# Patient Record
Sex: Male | Born: 1940 | Race: White | Hispanic: No | Marital: Married | Smoking: Never smoker
Health system: Southern US, Community
[De-identification: ages and names within clinical notes are randomized; demographics above are authoritative.]

## PROBLEM LIST (undated history)

## (undated) DIAGNOSIS — I10 Essential (primary) hypertension: Secondary | ICD-10-CM

## (undated) DIAGNOSIS — M1990 Unspecified osteoarthritis, unspecified site: Secondary | ICD-10-CM

## (undated) DIAGNOSIS — R9439 Abnormal result of other cardiovascular function study: Secondary | ICD-10-CM

## (undated) DIAGNOSIS — E785 Hyperlipidemia, unspecified: Secondary | ICD-10-CM

## (undated) DIAGNOSIS — I213 ST elevation (STEMI) myocardial infarction of unspecified site: Secondary | ICD-10-CM

## (undated) DIAGNOSIS — Z955 Presence of coronary angioplasty implant and graft: Secondary | ICD-10-CM

## (undated) DIAGNOSIS — K219 Gastro-esophageal reflux disease without esophagitis: Secondary | ICD-10-CM

## (undated) DIAGNOSIS — J189 Pneumonia, unspecified organism: Secondary | ICD-10-CM

## (undated) DIAGNOSIS — J45909 Unspecified asthma, uncomplicated: Secondary | ICD-10-CM

## (undated) DIAGNOSIS — I2510 Atherosclerotic heart disease of native coronary artery without angina pectoris: Secondary | ICD-10-CM

## (undated) HISTORY — DX: Hyperlipidemia, unspecified: E78.5

## (undated) HISTORY — DX: Atherosclerotic heart disease of native coronary artery without angina pectoris: I25.10

## (undated) HISTORY — DX: ST elevation (STEMI) myocardial infarction of unspecified site: I21.3

## (undated) HISTORY — DX: Abnormal result of other cardiovascular function study: R94.39

## (undated) HISTORY — DX: Presence of coronary angioplasty implant and graft: Z95.5

## (undated) HISTORY — DX: Essential (primary) hypertension: I10

## (undated) HISTORY — PX: COLONOSCOPY: SHX174

---

## 1997-10-24 ENCOUNTER — Encounter: Admission: RE | Admit: 1997-10-24 | Discharge: 1998-01-22 | Payer: Self-pay | Admitting: *Deleted

## 2012-09-14 ENCOUNTER — Encounter (HOSPITAL_COMMUNITY): Payer: Self-pay | Admitting: Emergency Medicine

## 2012-09-14 ENCOUNTER — Emergency Department (HOSPITAL_COMMUNITY)
Admission: EM | Admit: 2012-09-14 | Discharge: 2012-09-14 | Disposition: A | Discharge status: Home / Self Care | Payer: Medicare Other | Attending: Emergency Medicine | Admitting: Emergency Medicine

## 2012-09-14 DIAGNOSIS — M5430 Sciatica, unspecified side: Secondary | ICD-10-CM | POA: Insufficient documentation

## 2012-09-14 DIAGNOSIS — S7401XA Injury of sciatic nerve at hip and thigh level, right leg, initial encounter: Secondary | ICD-10-CM

## 2012-09-14 DIAGNOSIS — Z8739 Personal history of other diseases of the musculoskeletal system and connective tissue: Secondary | ICD-10-CM | POA: Insufficient documentation

## 2012-09-14 DIAGNOSIS — Z8719 Personal history of other diseases of the digestive system: Secondary | ICD-10-CM | POA: Insufficient documentation

## 2012-09-14 DIAGNOSIS — J45909 Unspecified asthma, uncomplicated: Secondary | ICD-10-CM | POA: Insufficient documentation

## 2012-09-14 HISTORY — DX: Unspecified osteoarthritis, unspecified site: M19.90

## 2012-09-14 HISTORY — DX: Unspecified asthma, uncomplicated: J45.909

## 2012-09-14 HISTORY — DX: Gastro-esophageal reflux disease without esophagitis: K21.9

## 2012-09-14 MED ORDER — NAPROXEN 500 MG PO TABS: 500.0000 mg | ORAL_TABLET | Freq: Two times a day (BID) | ORAL | Status: DC

## 2012-09-14 MED ORDER — CYCLOBENZAPRINE HCL 10 MG PO TABS
10.0000 mg | ORAL_TABLET | Freq: Once | ORAL | Status: AC
  Administered 2012-09-14: 10 mg via ORAL
  Filled 2012-09-14: qty 1

## 2012-09-14 MED ORDER — HYDROMORPHONE HCL PF 1 MG/ML IJ SOLN
1.0000 mg | Freq: Once | INTRAMUSCULAR | Status: AC
  Administered 2012-09-14: 1 mg via INTRAVENOUS
  Filled 2012-09-14: qty 1

## 2012-09-14 MED ORDER — DIAZEPAM 5 MG PO TABS: 5.0000 mg | ORAL_TABLET | Freq: Three times a day (TID) | ORAL | Status: DC | PRN

## 2012-09-14 MED ORDER — HYDROMORPHONE HCL 2 MG PO TABS
2.0000 mg | ORAL_TABLET | Freq: Once | ORAL | Status: AC
  Administered 2012-09-14: 2 mg via ORAL
  Filled 2012-09-14: qty 1

## 2012-09-14 MED ORDER — ONDANSETRON HCL 4 MG/2ML IJ SOLN
4.0000 mg | Freq: Once | INTRAMUSCULAR | Status: AC
  Administered 2012-09-14: 4 mg via INTRAVENOUS
  Filled 2012-09-14: qty 2

## 2012-09-14 MED ORDER — ONDANSETRON HCL 4 MG PO TABS: 4.0000 mg | ORAL_TABLET | Freq: Three times a day (TID) | ORAL | Status: DC | PRN

## 2012-09-14 MED ORDER — HYDROMORPHONE HCL 2 MG PO TABS: 2.0000 mg | ORAL_TABLET | ORAL | Status: DC | PRN

## 2012-09-14 MED ORDER — KETOROLAC TROMETHAMINE 30 MG/ML IJ SOLN
30.0000 mg | Freq: Once | INTRAMUSCULAR | Status: AC
  Administered 2012-09-14: 30 mg via INTRAVENOUS
  Filled 2012-09-14: qty 1

## 2012-09-14 NOTE — ED Notes (Signed)
Patient states he has white coat syndrome and his bp elevates when around doctors or hospitals.   Patient claims he also has stomach burning secondary to taking ASA 2 x Q4H for 2 years.

## 2012-09-14 NOTE — ED Notes (Signed)
Patient states he has been having sciatica pain for 10 days.  Patient states he did see his primary doctor last Tuesday who diagnosed.  Patient states that he took a 6 day pack of prednisone, but took only 5 days of the medication because it did not work for him.  Patient states extremely uncomfortable.  Patient advised has not been been able to move to the side or lay on his back or stand.    Patient states he has been taking hydrocodone 750 mg, but that it is no longer working.

## 2012-09-14 NOTE — ED Notes (Signed)
Patient placed on O2 2L [place] for dropped sats.

## 2012-09-14 NOTE — ED Provider Notes (Signed)
History     CSN: [id number]  Arrival date & time 09/14/12  0736   First MD Initiated Contact with Patient 09/14/12 [phone]      Chief Complaint  Patient presents with  . Sciatica    R sided low back back with radiation all the way down R leg.     (Consider location/radiation/quality/duration/timing/severity/associated sxs/prior treatment) The history is provided by the patient.   72 year old male comes in complaining of pain in the right buttock radiating down his right leg to his right foot. This is been present for the last 10 days and is getting worse. It started after he had done some unusual lifting. Pain is severe and he rates at 10/10. It is worse if he stands or tries to walk but he is not able to find a position which is comfortable. He has some numbness in his right foot. Has not noticed any weakness. There is no bowel or bladder dysfunction. He saw his PCP who gave him a course of prednisone which did not give any relief. He has been taking hydrocodone which gives only slight relief. He did have a prior episode of similar pain about 5 years ago.  Past Medical History  Diagnosis Date  . Arthritis   . Asthma   . GERD (gastroesophageal reflux disease)     History reviewed. No pertinent past surgical history.  No family history on file.  History  Substance Use Topics  . Smoking status: Never Smoker   . Smokeless tobacco: Not on file  . Alcohol Use: No      Review of Systems  All other systems reviewed and are negative.    Allergies  Review of patient's allergies indicates no known allergies.  Home Medications  No current outpatient prescriptions on file.  BP 188/104  Pulse 91  Temp(Src) 98.5 F (36.9 C) (Oral)  Resp 22  Ht 6\' 1"  (1.854 m)  Wt 215 lb (97.523 kg)  BMI 28.37 kg/m2  SpO2 97%  Physical Exam  Nursing note and vitals reviewed.  72 year old male, resting comfortably and in no acute distress. Vital signs are significant for hypertension with  blood pressure 180/104, and tachypnea with respiratory rate of 20 to. Oxygen saturation is 97%, which is normal. Head is normocephalic and atraumatic. PERRLA, EOMI. Oropharynx is clear. Neck is nontender and supple without adenopathy or JVD. Back is mildly tender in the lower lumbar area with mild right paralumbar spasm. Straight leg raise is positive on the right at 15 and crossed straight leg raise is positive on the left at 45. There is no CVA tenderness. Lungs are clear without rales, wheezes, or rhonchi. Chest is nontender. Heart has regular rate and rhythm without murmur. Abdomen is soft, flat, nontender without masses or hepatosplenomegaly and peristalsis is normoactive. Extremities have no cyanosis or edema, full range of motion is present. Skin is warm and dry without rash. Neurologic: Mental status is normal, cranial nerves are intact, there are no motor or sensory deficits. Sensation is normal throughout his right leg and deep tendon reflexes are symmetric and muscle strength is normal and all the muscles of his right  ED Course  Procedures (including critical care time)   1. Sciatic nerve injury, right, initial encounter       MDM  Right-sided sciatica, without evidence of neurologic injury. He will be treated with ketorolac, hydromorphone, and cyclobenzaprine.  He got partial relief with this treatment. Hydromorphone was repeated with improved relief. He'll be discharged with  prescriptions for hydromorphone, naproxen, and diazepam. I have discussed with the patient that he needs to go through 4-6 weeks of conservative treatment prior to consideration for surgery but he is given referral to on-call neurosurgeon.        Dione Booze, MD 09/14/12 1056

## 2012-09-14 NOTE — ED Notes (Signed)
Went to discharge patient.   Patient wants prescription for zofran to take home.  Patient also wants another dose of pain medication because they have a 30-40 minute drive home.

## 2016-02-16 DIAGNOSIS — Z955 Presence of coronary angioplasty implant and graft: Secondary | ICD-10-CM

## 2016-02-16 DIAGNOSIS — I213 ST elevation (STEMI) myocardial infarction of unspecified site: Secondary | ICD-10-CM

## 2016-02-16 DIAGNOSIS — I2510 Atherosclerotic heart disease of native coronary artery without angina pectoris: Secondary | ICD-10-CM | POA: Insufficient documentation

## 2016-02-16 HISTORY — DX: Presence of coronary angioplasty implant and graft: Z95.5

## 2016-02-16 HISTORY — DX: ST elevation (STEMI) myocardial infarction of unspecified site: I21.3

## 2016-02-16 HISTORY — DX: Atherosclerotic heart disease of native coronary artery without angina pectoris: I25.10

## 2016-08-13 DIAGNOSIS — R9439 Abnormal result of other cardiovascular function study: Secondary | ICD-10-CM | POA: Insufficient documentation

## 2016-08-13 HISTORY — DX: Abnormal result of other cardiovascular function study: R94.39

## 2016-09-02 ENCOUNTER — Encounter: Payer: Self-pay | Admitting: *Deleted

## 2016-09-02 DIAGNOSIS — I10 Essential (primary) hypertension: Secondary | ICD-10-CM | POA: Insufficient documentation

## 2016-09-02 DIAGNOSIS — E785 Hyperlipidemia, unspecified: Secondary | ICD-10-CM | POA: Insufficient documentation

## 2016-09-03 ENCOUNTER — Encounter: Payer: Self-pay | Admitting: *Deleted

## 2016-09-03 ENCOUNTER — Institutional Professional Consult (permissible substitution) (INDEPENDENT_AMBULATORY_CARE_PROVIDER_SITE_OTHER): Payer: Medicare Other | Admitting: Cardiothoracic Surgery

## 2016-09-03 ENCOUNTER — Encounter: Payer: Self-pay | Admitting: Cardiothoracic Surgery

## 2016-09-03 VITALS — BP 143/82 | HR 63 | Resp 16 | Ht 72.5 in | Wt 214.0 lb

## 2016-09-03 DIAGNOSIS — I2510 Atherosclerotic heart disease of native coronary artery without angina pectoris: Secondary | ICD-10-CM | POA: Diagnosis not present

## 2016-09-03 NOTE — Progress Notes (Signed)
PCP is Forrest MoronUEHLE, STEPHEN, MD Referring Provider is Caryl Adahiu, Jenyung Andy, MD  Chief Complaint  Patient presents with  . 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 At that time the patient was admitted to the [facility] and had urgent PCI of the RCA with 2 drug-eluting stents. He had inferior wall hypokinesia. He had a untoward reaction to Phenergan with altered mental status vomiting and possible aspiration. He was in ICU for approximately 3 days but eventually stabilized and has recovered. He completed cardiac [facility].  The patient was noted have residual multivessel disease at the time of the cath in September. His LAD diagonal had 90% stenosis. The ramus intermediate had 70% stenosis in the circumflex was totally occluded. LVEF was 40%.  The patient was seen back by his cardiologist Dr. Rhona Leavenshiu who performed a stress test with echocardiogram. This showed a positive ischemic response. He subsequently underwent cardiac catheterization on March 19. This showed severe stenosis of the LAD, diagonal and ramus with chronic occlusion of he circumflex with collaterals from the right coronary. RCA stents were patent. EF was 35-40 percent. The cardiologist has recommended consideration for surgical coronary revascularization based on his coronary anatomy and positive stress test and reduced LV function. Patient also has business commitments that require international travel and he is concerned over further cardiac events while out of accessible medical care.  The patient currently works 8-12 hours daily and denies angina fatigue or symptoms of  heart failure including PND orthopnea. He feels he has recovered fairly well from his event last September, 6 months ago.  The patient has been on Effient now for 6 months after his PCI without significant bleeding complications Past Medical History:  Diagnosis Date  . Abnormal stress echo 08/13/2016  . Arthritis   . Asthma   . 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 CAD in native artery   . GERD (gastroesophageal reflux disease)   . History of heart artery stent 02/16/2016  . Hyperlipidemia   . Hypertension   . STEMI (ST elevation myocardial infarction) (HCC) 02/16/2016    No past surgical history on file.  No family history on file.  Social History Social History  Substance Use Topics  . Smoking status: Never Smoker  . Smokeless tobacco: Never Used  . Alcohol use No    Current Outpatient Prescriptions  Medication Sig Dispense Refill  . ALPRAZolam (XANAX) 0.5 MG tablet Take 0.25-0.5 mg by mouth 3 (three) times daily as needed for sleep.    Marland Kitchen. aspirin EC 81 MG tablet Take 81 mg by mouth daily.    Marland Kitchen. atorvastatin (LIPITOR) 80 MG tablet Take 80 mg by mouth daily.    Marland Kitchen. esomeprazole (NEXIUM) 20 MG capsule Take 20 mg by mouth daily before breakfast.    . HYDROcodone-acetaminophen (NORCO/VICODIN) 5-325 MG tablet Take 1 tablet by mouth every 3 (three) hours as needed for moderate pain. MORNING/NOON    . lisinopril (PRINIVIL,ZESTRIL) 2.5 MG tablet Take 2.5 mg by mouth daily. TAKE 2 TABLETS DAILY (5 MG)    . metoprolol tartrate (LOPRESSOR) 25 MG tablet Take 25 mg by mouth 2 (two) times daily.    .Marland Kitchen  prasugrel (EFFIENT) 10 MG TABS tablet Take 10 mg by mouth daily.     No current facility-administered medications for this visit.     Allergies  Allergen Reactions  . Phenergan [Promethazine] Other (See Comments)    SEVERE AGITATION    Review of Systems       Patient's wife has significant dementia   Review of Systems :  [ y ] = yes, [  ] = no         General :  Weight gain [   ]    Weight loss  [   ]  Fatigue [  ]  Fever [  ]  Chills  [  ]                                Weakness  [ mild ]           HEENT    Headache [  ]  Dizziness [  ]  Blurred vision [  ] Glaucoma  [  ]                          Nosebleeds [  ] Painful or loose teeth [  ]        Cardiac :  Chest pain/ pressure [  ]  Resting SOB [  ] exertional SOB [mild  ]                        Orthopnea [  ]  Pedal edema  [  ]  Palpitations [  ] Syncope/presyncope [ ]                         Paroxysmal nocturnal dyspnea [  ]         Pulmonary : cough [  ]  wheezing [  ]  Hemoptysis [  ] Sputum [  ] Snoring [  ]                              Pneumothorax [  ]  Sleep apnea [  ]        GI : Vomiting [  ]  Dysphagia [  ]  Melena  [  ]  Abdominal pain [  ] BRBPR [  ]              Heart burn [  ]  Constipation [  ] Diarrhea  [  ] Colonoscopy [  yes ]        GU : Hematuria [  ]  Dysuria [  ]  Nocturia [  ] UTI's [  ]        Vascular : Claudication [  ]  Rest pain [  ]  DVT [  ] Vein stripping [  ] leg ulcers [  ]                          TIA [  ] Stroke [  ]  Varicose veins [  ]        NEURO :  Headaches  [  ] Seizures [  ] Vision changes [  ] Paresthesias [  ]  Seizures [  ]        Musculoskeletal :  Arthritis [  ] Gout  [  ]  Back pain [  ]  Joint pain [  ]pain from cervical arthritis        Skin :  Rash [  ]  Melanoma [  ] Sores [  ]        Heme : Bleeding problems [  ]Clotting Disorders [  ] Anemia [  ]Blood Transfusion [ ]         Endocrine : Diabetes [  ] Heat or Cold intolerance [  ] Polyuria [  ]excessive thirst [ ]         Psych : Depression [  ]  Anxiety [  ]  Psych hospitalizations [  ] Memory change [  ]                                               BP (!) 143/82 (BP Location: Left Arm, Patient Position: Sitting, Cuff Size: Large)   Pulse 63   Resp 16   Ht 6' 0.5" (1.842 m)   Wt 214 lb (97.1 kg)   SpO2 97% Comment: ON RA  BMI  28.62 kg/m  Physical Exam     Physical Exam  General: Well-appearing 76 year old Caucasian male no acute distress HEENT: Normocephalic pupils equal , dentition adequate Neck: Supple without JVD, adenopathy, or bruit Chest: Clear to auscultation, symmetrical breath sounds, no rhonchi, no tenderness             or deformity Cardiovascular: Regular rate and rhythm, no murmur, no gallop, peripheral pulses             palpable in all extremities Abdomen:  Soft, nontender, no palpable mass or organomegaly Extremities: Warm, well-perfused, no clubbing cyanosis edema or tenderness,              no venous stasis changes of the legs Rectal/GU: Deferred Neuro: Grossly non--focal and symmetrical throughout Skin: Clean and dry without rash or ulceration   Diagnostic Tests:  most recent coronary catheterization personally reviewed.   the patient has high-grade stenosis of  the LAD- diagonal. The patient has total occlusion of the circumflex with collateralization of small targets from the right coronary The patient has 70-75% stenosis of a large ramus  Impression:  severe three-vessel coronary disease with moderate LV dysfunction and positive-ischemic response to stress test   Plan:I discussed the role of CABG for treatment of the patient's CAD extensively. I feel the patient would benefit from surgical coronary revascularization with respect to preservation of LV function and improved survival. The details of surgery including the operative procedure and the hospital recovery  werereviewed in detail.   I will speak to the patient's cardiologist Dr. Rhona Leavens and determine when the Effient can be stopped prior to surgery. The patient is now 6 months after PCI and doing well.   after I discuss the patient with his cardiologist a tentative date for surgery be scheduled in mid April.  Mikey Bussing, MD [facility] [phone]

## 2016-09-04 HISTORY — PX: CARDIAC CATHETERIZATION: SHX172

## 2016-09-05 ENCOUNTER — Other Ambulatory Visit: Payer: Self-pay | Admitting: *Deleted

## 2016-09-05 DIAGNOSIS — I2510 Atherosclerotic heart disease of native coronary artery without angina pectoris: Secondary | ICD-10-CM

## 2016-09-20 ENCOUNTER — Ambulatory Visit (HOSPITAL_BASED_OUTPATIENT_CLINIC_OR_DEPARTMENT_OTHER)
Admission: RE | Admit: 2016-09-20 | Discharge: 2016-09-20 | Disposition: A | Discharge status: Home / Self Care | Payer: Medicare Other | Source: Ambulatory Visit | Attending: Cardiothoracic Surgery | Admitting: Cardiothoracic Surgery

## 2016-09-20 ENCOUNTER — Ambulatory Visit (HOSPITAL_COMMUNITY)
Admission: RE | Admit: 2016-09-20 | Discharge: 2016-09-20 | Disposition: A | Discharge status: Home / Self Care | Payer: Medicare Other | Source: Ambulatory Visit | Attending: Cardiothoracic Surgery | Admitting: Cardiothoracic Surgery

## 2016-09-20 ENCOUNTER — Encounter (HOSPITAL_COMMUNITY)
Admission: RE | Admit: 2016-09-20 | Discharge: 2016-09-20 | Disposition: A | Discharge status: Home / Self Care | Payer: Medicare Other | Source: Ambulatory Visit | Attending: Cardiothoracic Surgery | Admitting: Cardiothoracic Surgery

## 2016-09-20 ENCOUNTER — Encounter (HOSPITAL_COMMUNITY): Payer: Self-pay

## 2016-09-20 DIAGNOSIS — I252 Old myocardial infarction: Secondary | ICD-10-CM | POA: Insufficient documentation

## 2016-09-20 DIAGNOSIS — I10 Essential (primary) hypertension: Secondary | ICD-10-CM | POA: Diagnosis not present

## 2016-09-20 DIAGNOSIS — Z01818 Encounter for other preprocedural examination: Secondary | ICD-10-CM | POA: Insufficient documentation

## 2016-09-20 DIAGNOSIS — I2510 Atherosclerotic heart disease of native coronary artery without angina pectoris: Secondary | ICD-10-CM

## 2016-09-20 DIAGNOSIS — I498 Other specified cardiac arrhythmias: Secondary | ICD-10-CM | POA: Diagnosis not present

## 2016-09-20 DIAGNOSIS — Z79899 Other long term (current) drug therapy: Secondary | ICD-10-CM | POA: Insufficient documentation

## 2016-09-20 DIAGNOSIS — R942 Abnormal results of pulmonary function studies: Secondary | ICD-10-CM | POA: Insufficient documentation

## 2016-09-20 DIAGNOSIS — Z01810 Encounter for preprocedural cardiovascular examination: Secondary | ICD-10-CM | POA: Diagnosis not present

## 2016-09-20 DIAGNOSIS — Z7982 Long term (current) use of aspirin: Secondary | ICD-10-CM | POA: Diagnosis not present

## 2016-09-20 DIAGNOSIS — I440 Atrioventricular block, first degree: Secondary | ICD-10-CM | POA: Insufficient documentation

## 2016-09-20 HISTORY — DX: Pneumonia, unspecified organism: J18.9

## 2016-09-20 LAB — PULMONARY FUNCTION TEST
DL/VA % pred: 101 %
DL/VA: 4.77 ml/min/mmHg/L
DLCO cor % pred: 63 %
DLCO cor: 22.31 ml/min/mmHg
DLCO unc % pred: 59 %
DLCO unc: 20.87 ml/min/mmHg
FEF 25-75 Post: 4.35 L/sec
FEF 25-75 Pre: 3.57 L/sec
FEF2575-%Change-Post: 21 %
FEF2575-%Pred-Post: 175 %
FEF2575-%Pred-Pre: 143 %
FEV1-%Change-Post: 6 %
FEV1-%Pred-Post: 77 %
FEV1-%Pred-Pre: 72 %
FEV1-Post: 2.62 L
FEV1-Pre: 2.45 L
FEV1FVC-%Change-Post: 0 %
FEV1FVC-%Pred-Pre: 115 %
FEV6-%Change-Post: 7 %
FEV6-%Pred-Post: 71 %
FEV6-%Pred-Pre: 66 %
FEV6-Post: 3.11 L
FEV6-Pre: 2.9 L
FEV6FVC-%Pred-Post: 106 %
FEV6FVC-%Pred-Pre: 106 %
FVC-%Change-Post: 7 %
FVC-%Pred-Post: 67 %
FVC-%Pred-Pre: 62 %
FVC-Post: 3.11 L
FVC-Pre: 2.9 L
Post FEV1/FVC ratio: 84 %
Post FEV6/FVC ratio: 100 %
Pre FEV1/FVC ratio: 85 %
Pre FEV6/FVC Ratio: 100 %
RV % pred: 92 %
RV: 2.43 L
TLC % pred: 74 %
TLC: 5.51 L

## 2016-09-20 LAB — COMPREHENSIVE METABOLIC PANEL
ALT: 16 U/L — ABNORMAL LOW (ref 17–63)
AST: 21 U/L (ref 15–41)
Albumin: 4 g/dL (ref 3.5–5.0)
Alkaline Phosphatase: 70 U/L (ref 38–126)
Anion gap: 8 (ref 5–15)
BUN: 14 mg/dL (ref 6–20)
CO2: 23 mmol/L (ref 22–32)
Calcium: 8.9 mg/dL (ref 8.9–10.3)
Chloride: 105 mmol/L (ref 101–111)
Creatinine, Ser: 0.97 mg/dL (ref 0.61–1.24)
GFR calc Af Amer: >60 mL/min (ref >60)
GFR calc non Af Amer: >60 mL/min (ref >60)
Glucose, Bld: 120 mg/dL — ABNORMAL HIGH (ref 65–99)
Potassium: 3.9 mmol/L (ref 3.5–5.1)
Sodium: 136 mmol/L (ref 135–145)
Total Bilirubin: 0.7 mg/dL (ref 0.3–1.2)
Total Protein: 6.4 g/dL — ABNORMAL LOW (ref 6.5–8.1)

## 2016-09-20 LAB — CBC
HCT: 37 % — ABNORMAL LOW (ref 39.0–52.0)
Hemoglobin: 12.5 g/dL — ABNORMAL LOW (ref 13.0–17.0)
MCH: 29.9 pg (ref 26.0–34.0)
MCHC: 33.8 g/dL (ref 30.0–36.0)
MCV: 88.5 fL (ref 78.0–100.0)
Platelets: 138 10*3/uL — ABNORMAL LOW (ref 150–400)
RBC: 4.18 MIL/uL — ABNORMAL LOW (ref 4.22–5.81)
RDW: 12.6 % (ref 11.5–15.5)
WBC: 7 10*3/uL (ref 4.0–10.5)

## 2016-09-20 LAB — PROTIME-INR
INR: 1
Prothrombin Time: 13.2 seconds (ref 11.4–15.2)

## 2016-09-20 LAB — SURGICAL PCR SCREEN
MRSA, PCR: NEGATIVE
Staphylococcus aureus: NEGATIVE

## 2016-09-20 LAB — URINALYSIS, ROUTINE W REFLEX MICROSCOPIC
Bilirubin Urine: NEGATIVE
Glucose, UA: NEGATIVE mg/dL
Hgb urine dipstick: NEGATIVE
Ketones, ur: NEGATIVE mg/dL
Leukocytes, UA: NEGATIVE
Nitrite: NEGATIVE
Protein, ur: NEGATIVE mg/dL
Specific Gravity, Urine: 1.01 (ref 1.005–1.030)
pH: 5 (ref 5.0–8.0)

## 2016-09-20 LAB — APTT: aPTT: 29 seconds (ref 24–36)

## 2016-09-20 LAB — ABO/RH: ABO/RH(D): A POS

## 2016-09-20 MED ORDER — ALBUTEROL SULFATE (2.5 MG/3ML) 0.083% IN NEBU
2.5000 mg | INHALATION_SOLUTION | Freq: Once | RESPIRATORY_TRACT | Status: AC
  Administered 2016-09-20: 2.5 mg via RESPIRATORY_TRACT

## 2016-09-20 NOTE — Progress Notes (Signed)
Pre-op Cardiac Surgery  Carotid Findings:  Findings suggest 1-39% internal carotid artery stenosis bilaterally. Vertebral arteries are patent with antegrade flow.   Upper Extremity Right Left  Brachial Pressures 161-Triphasic 161-Triphasic  Radial Waveforms Triphasic Triphasic  Ulnar Waveforms Triphasic Triphasic  Palmar Arch (Allen's Test) Signal decreases <50% with radial compression, is unaffected with ulnar compression. Signal decreases 50% with radial and ulnar compression.    Lower  Extremity Right Left  Dorsalis Pedis Triphasic Triphasic  Posterior Tibial Triphasic Triphasic    09/20/2016 1:16 PM Gertie Fey, BS, RVT, RDCS, RDMS

## 2016-09-20 NOTE — Progress Notes (Signed)
Anesthesia Chart Review: Patient is a 76 year old male scheduled for CABG on 09/24/16 by Dr. Donata Clay.  History includes non-smoker, CAD s/p STEMI with DES mid RCA and ostial PDA 02/16/16 (with residual LAD and Ramus lesions with planned PCI but patient had been putting off; follow-up stress echo + anterior ischemia, repeat LHC 08/26/16-->CABG recommended), HTN, HLD, childhood asthma, GERD, arthritis.  - PCP is Dr. Forrest Moron. - Cardiologist is Dr. Holley Raring [facility]; see Care Everywhere). Patient reported to his PAT RN this morning that he had chest pain X 20 minutes that went away after belching. The RN notified anesthesiologist Dr. Okey Dupre, and patient was instructed that "if he has any chest pain that doesn't go away, and is different to let us know and Dr Donata Clay know or to call EMS and go to the ED." Message forwarded to TCTS RN Ryan regarding events.  Meds include Xanax, ASA 81 mg, Lipitor, Nexium, Norco, lisinopril, Lopressor, Effient (last dose was on 09/16/16).   BP (!) 175/85   Pulse 66   Temp 36.5 C   Resp 18   Ht 6' 0.5" (1.842 m)   Wt 222 lb 14.4 oz (101.1 kg)   SpO2 98%   BMI 29.82 kg/m   EKG 09/20/16: SR, first degree AV block, inferior infarct (age undetermined).  Cardiac cath 08/26/16 [facility]; Care Everywhere): LMCA: Lesion on LMCA: Ostial.10 mm length . LAD: Lesion on Mid LAD: Mid subsection.90% stenosis. Bifurcation lesion. Lesion on 1st Diag: 90% stenosis. Bifurcation lesion. Lesion on 2nd Diag: Mid subsection.95% stenosis. LCx: Lesion on Prox CX: 100% stenosis. RCA: Lesion on Mid RCA: 20% stenosis. Ramus: Lesion on Ramus: 75% stenosis. Diagnostic Procedure Summary: 1. Severe LAD, Diag1, Diag2, Ramus lesions 2. Cir CTO with collateral from right 3. RCA stents patent 4. Inferior wall, inferoapical wall hypokinesis, LVEF 35-40% Diagnostic Procedure Recommendations CTS consult for CABG.  Stress Echo 08/13/16 [facility]  Cardiology; scanned under Results Review): Positive stress echocardiogram for anterior/apical ischemia (LAD territory).  Echo 02/16/16 [facility]; Care Everywhere): Findings Mitral Valve: Structurally normal mitral valve with good mobility and no significant regurgitation. Aortic Valve: Structurally normal aortic valve with good leaflet mobility, and no regurgitation. Tricuspid Valve: Tricuspid valve is structurally normal. No significant tricuspid regurgitation. Pulmonic Valve: The pulmonic valve was not well visualized No Doppler evidence of pulmonic stenosis or insufficiency. Left Atrium: Normal size left atrium. Left Ventricle: Ejection fraction is visually estimated at 50-55% Preserved left ventricle function with inferior wall hypokinesis. Right Atrium: Normal right atrium. Right Ventricle: Normal right ventricular size and function. Pericardial Effusion: No evidence of pericardial effusion. Miscellaneous: The aorta is within normal limits. IVC not visualized.  Carotid Duplex 09/20/16 (Preliminary): Findings suggest 1-39% internal carotid artery stenosis bilaterally. Vertebral arteries are patent with antegrade flow.  CXR 09/20/16: IMPRESSION: No active cardiopulmonary disease.  PFTs 09/20/16: FVC 2.90 (62%), FEV1 2.45 (72%), DLCOunc 20.87 (59%).   Preoperative labs noted. A1c is in process.   If no acute changes then I anticipate that he can proceed as planned.  Velna Ochs [facility] Short Stay Center/Anesthesiology Phone [phone] 09/20/2016 1:58 PM

## 2016-09-20 NOTE — Progress Notes (Addendum)
PCP is Dr. Forrest Moron Cardiologist is Dr. Doreatha Massed Echo, stress test, and card cath 08-2016 Pt last dose of effient was 08-16-16 as directed by Dr Gilbert Hutchinson Pt reports he had chest pain on the way here states it lasted and he burped and felt better.  Reports no fever or cough.  Dr Gilbert Hutchinson called and informed of chest pain. States since the chest pain went away he is ok. Instructed Gilbert Hutchinson if he has any chest pain that doesn't go away, and is different to let us know and Dr Gilbert Hutchinson know or to call EMS and go to the ED. Voices understanding.

## 2016-09-20 NOTE — Pre-Procedure Instructions (Signed)
NAYQUAN EVINGER  09/20/2016    Your procedure is scheduled on Tuesday, April 17.  Report to [facility] Admitting at 5:30 AM                  Your surgery or procedure is scheduled for 7:30 AM   Call this number if you have problems the morning of surgery:[phone]                 For any other questions, please call [phone], Monday - Friday 8 AM - 4 PM.   Remember:  Do not eat food or drink liquids after midnight Monday, April 17.  Take these medicines the morning of surgery with A SIP OF WATER : esomeprazole (NEXIUM),metoprolol tartrate (LOPRESSOR).                    Take if needed: HYDROcodone-acetaminophen (NORCO/VICODIN).                    STOP taking Aspirin Products (Goody Powder, Excedrin Migraine), Ibuprofen (Advil), Naproxen (Aleve), Viiamins and Herbal Products (ie Fish Oil)  Do not wear jewelry, make-up or nail polish.                 Stop prasugrel (EFFIENT) and Aspirin as instructed by Dr Donata Clay.  Special instructions:   [place]- Preparing For Surgery  Before surgery, you can play an important role. Because skin is not sterile, your skin needs to be as free of germs as possible. You can reduce the number of germs on your skin by washing with CHG (chlorahexidine gluconate) Soap before surgery.  CHG is an antiseptic cleaner which kills germs and bonds with the skin to continue killing germs even after washing.  Please do not use if you have an allergy to CHG or antibacterial soaps. If your skin becomes reddened/irritated stop using the CHG.  Do not shave (including legs and underarms) for at least 48 hours prior to first CHG shower. It is OK to shave your face.  Please follow these instructions carefully.   1. Shower the NIGHT BEFORE SURGERY and the MORNING OF SURGERY with CHG.   2. If you chose to wash your hair, wash your hair first as usual with your normal shampoo.  3. After you shampoo, rinse your hair and body thoroughly to remove the  shampoo.  4. Use CHG as you would any other liquid soap. You can apply CHG directly to the skin and wash gently with a scrungie or a clean washcloth.   5. Apply the CHG Soap to your body ONLY FROM THE NECK DOWN.  Do not use on open wounds or open sores. Avoid contact with your eyes, ears, mouth and genitals (private parts). Wash genitals (private parts) with your normal soap.  6. Wash thoroughly, paying special attention to the area where your surgery will be performed.  7. Thoroughly rinse your body with warm water from the neck down.  8. DO NOT shower/wash with your normal soap after using and rinsing off the CHG Soap.  9. Pat yourself dry with a CLEAN TOWEL.   10. Wear CLEAN PAJAMAS   11. Place CLEAN SHEETS on your bed the night of your first shower and DO NOT SLEEP WITH PETS.  Day of Surgery: Do not apply any deodorants/lotions. Please wear clean clothes to the hospital/surgery center.    Do not wear lotions, powders, or perfumes, or deodorant.  Do not shave  48 hours prior to surgery.  Men may shave face and neck.  Do not bring valuables to the hospital.  [facility] not responsible for any belongings or valuables.  Contacts, dentures or bridgework may not be worn into surgery.  Leave your suitcase in the car.  After surgery it may be brought to your room.  For patients admitted to the hospital, discharge time will be determined by your treatment team.  Please read over the following fact sheets that you were given: [facility]- Preparing For Surgery and Patient Instructions for Mupirocin Application, Incentive Spirometry, Pain Booklet, Surgical Site Infections

## 2016-09-21 LAB — HEMOGLOBIN A1C
Hgb A1c MFr Bld: 6.1 % — ABNORMAL HIGH (ref 4.8–5.6)
Mean Plasma Glucose: 128 mg/dL

## 2016-09-22 LAB — VAS US DOPPLER PRE CABG
LEFT ECA DIAS: -9 cm/s
LEFT VERTEBRAL DIAS: 5 cm/s
Left CCA dist dias: 14 cm/s
Left CCA dist sys: 54 cm/s
Left CCA prox dias: 11 cm/s
Left CCA prox sys: 79 cm/s
Left ICA dist dias: -20 cm/s
Left ICA dist sys: -63 cm/s
Left ICA prox dias: -10 cm/s
Left ICA prox sys: -37 cm/s
RIGHT ECA DIAS: -9 cm/s
RIGHT VERTEBRAL DIAS: 10 cm/s
Right CCA prox dias: 14 cm/s
Right CCA prox sys: 77 cm/s
Right cca dist sys: -71 cm/s

## 2016-09-23 ENCOUNTER — Encounter (HOSPITAL_COMMUNITY): Payer: Self-pay | Admitting: Certified Registered Nurse Anesthetist

## 2016-09-23 ENCOUNTER — Encounter: Payer: Self-pay | Admitting: Cardiothoracic Surgery

## 2016-09-23 ENCOUNTER — Ambulatory Visit (INDEPENDENT_AMBULATORY_CARE_PROVIDER_SITE_OTHER): Payer: Medicare Other | Admitting: Cardiothoracic Surgery

## 2016-09-23 VITALS — BP 157/97 | HR 80 | Resp 20 | Ht 77.0 in | Wt 222.0 lb

## 2016-09-23 DIAGNOSIS — I2510 Atherosclerotic heart disease of native coronary artery without angina pectoris: Secondary | ICD-10-CM | POA: Diagnosis not present

## 2016-09-23 MED ORDER — TRANEXAMIC ACID (OHS) PUMP PRIME SOLUTION
2.0000 mg/kg | INTRAVENOUS | Status: DC
Start: 2016-09-24 — End: 2016-09-24
  Filled 2016-09-23: qty 2.02

## 2016-09-23 MED ORDER — DEXTROSE 5 % IV SOLN
750.0000 mg | INTRAVENOUS | Status: DC
  Filled 2016-09-23: qty 750

## 2016-09-23 MED ORDER — SODIUM CHLORIDE 0.9 % IV SOLN
INTRAVENOUS | Status: AC
  Administered 2016-09-24: 1 [IU]/h via INTRAVENOUS
  Filled 2016-09-23: qty 2.5

## 2016-09-23 MED ORDER — PAPAVERINE HCL 30 MG/ML IJ SOLN
INTRAMUSCULAR | Status: AC
  Administered 2016-09-24: 09:00:00
  Filled 2016-09-23: qty 2.5

## 2016-09-23 MED ORDER — CEFUROXIME SODIUM 1.5 G IJ SOLR
1.5000 g | INTRAMUSCULAR | Status: AC
  Administered 2016-09-24: 1.5 g via INTRAVENOUS
  Administered 2016-09-24: .75 g via INTRAVENOUS
  Filled 2016-09-23: qty 1.5

## 2016-09-23 MED ORDER — NITROGLYCERIN IN D5W 200-5 MCG/ML-% IV SOLN
2.0000 ug/min | INTRAVENOUS | Status: DC
Start: 2016-09-24 — End: 2016-09-24
  Filled 2016-09-23: qty 250

## 2016-09-23 MED ORDER — SODIUM CHLORIDE 0.9 % IV SOLN
30.0000 ug/min | INTRAVENOUS | Status: AC
  Administered 2016-09-24: 25 ug/min via INTRAVENOUS
  Administered 2016-09-24: 20 ug/min via INTRAVENOUS
  Filled 2016-09-23: qty 2

## 2016-09-23 MED ORDER — SODIUM CHLORIDE 0.9 % IV SOLN
1.5000 mg/kg/h | INTRAVENOUS | Status: AC
  Administered 2016-09-24: 1.5 mg/kg/h via INTRAVENOUS
  Filled 2016-09-23: qty 25

## 2016-09-23 MED ORDER — VANCOMYCIN HCL 10 G IV SOLR
1500.0000 mg | INTRAVENOUS | Status: AC
  Administered 2016-09-24: 1500 mg via INTRAVENOUS
  Filled 2016-09-23: qty 1500

## 2016-09-23 MED ORDER — MAGNESIUM SULFATE 50 % IJ SOLN
40.0000 meq | INTRAMUSCULAR | Status: DC
  Filled 2016-09-23: qty 10

## 2016-09-23 MED ORDER — SODIUM CHLORIDE 0.9 % IV SOLN
INTRAVENOUS | Status: DC
  Filled 2016-09-23: qty 30

## 2016-09-23 MED ORDER — POTASSIUM CHLORIDE 2 MEQ/ML IV SOLN
80.0000 meq | INTRAVENOUS | Status: DC
  Filled 2016-09-23: qty 40

## 2016-09-23 MED ORDER — DEXTROSE 5 % IV SOLN
0.0000 ug/min | INTRAVENOUS | Status: DC
  Filled 2016-09-23: qty 4

## 2016-09-23 MED ORDER — TRANEXAMIC ACID (OHS) BOLUS VIA INFUSION
15.0000 mg/kg | INTRAVENOUS | Status: AC
  Administered 2016-09-24: 1516.5 mg via INTRAVENOUS
  Filled 2016-09-23: qty 1517

## 2016-09-23 MED ORDER — DOPAMINE-DEXTROSE 3.2-5 MG/ML-% IV SOLN
0.0000 ug/kg/min | INTRAVENOUS | Status: DC
  Filled 2016-09-23: qty 250

## 2016-09-23 MED ORDER — DEXMEDETOMIDINE HCL IN NACL 400 MCG/100ML IV SOLN
0.1000 ug/kg/h | INTRAVENOUS | Status: AC
  Administered 2016-09-24: .3 ug/kg/h via INTRAVENOUS
  Administered 2016-09-24: 60 ug/h via INTRAVENOUS
  Filled 2016-09-23: qty 100

## 2016-09-23 NOTE — Progress Notes (Signed)
PCP is Forrest Moron, MD Referring Provider is Caryl Ada, MD  Chief Complaint  Patient presents with  . Coronary Artery Disease    Further discuss surgery scheduled for 09/24/16    HPI: The patient presents for final discussion and review after multivessel CABG plan for tomorrow His preoperative laboratory testing and scans were reviewed with the patient and his daughter. I reviewed the plan for bypass grafts to the LAD, diagonal, ramus intermediate, and hopefully the distal circumflex which is occluded proximally. I confirmed that the patient had stopped his Effient a week ago. No complaints of angina. We discussed the details of surgery the expected postoperative hospital recovery and the risks including arrhythmia, bleeding, blood transfusion, stroke, infection, postoperative pulmonary problems, death. Past Medical History:  Diagnosis Date  . Abnormal stress echo 08/13/2016  . Arthritis   . Asthma    as a child  . 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 CAD in native artery   . GERD (gastroesophageal reflux disease)   . History of heart artery stent 02/16/2016  . Hyperlipidemia   . Hypertension   . Pneumonia   . STEMI (ST elevation myocardial infarction) (HCC) 02/16/2016    Past Surgical History:  Procedure Laterality Date  . CARDIAC CATHE[place]IZ[place]04[place]  . COLONOSCOPY      Family History  Problem Relation Age of Onset  . Lung cancer Father     Social History Social History  Substance Use Topics  . Smoking status: Never Smoker  . Smokeless tobacco: Never Used  . Alcohol use No    Current Outpatient Prescriptions  Medication Sig Dispense Refill  . ALPRAZolam (XANAX) 0.5 MG tablet Take 0.25-0.5 mg by mouth 3 (three) times daily as needed for sleep.    Marland Kitchen aspirin EC 81 MG tablet Take 81 mg by mouth daily.    Marland Kitchen atorvastatin (LIPITOR) 80 MG tablet Take 80 mg by mouth daily.    Marland Kitchen esomeprazole (NEXIUM) 20 MG  capsule Take 20 mg by mouth every evening.     Marland Kitchen HYDROcodone-acetaminophen (NORCO/VICODIN) 5-325 MG tablet Take 1 tablet by mouth every 3 (three) hours as needed for moderate pain. MORNING/NOON    . lisinopril (PRINIVIL,ZESTRIL) 2.5 MG tablet Take 2.5 mg by mouth daily. TAKE 2 TABLETS DAILY (5 MG)    . metoprolol tartrate (LOPRESSOR) 25 MG tablet Take 25 mg by mouth 2 (two) times daily.    . prasugrel (EFFIENT) 10 MG TABS tablet Take 10 mg by mouth daily.     No current facility-administered medications for this visit.    Facility-Administered Medications Ordered in Other Visits  Medication Dose Route Frequency Provider Last Rate Last Dose  . [START ON 09/24/2016] cefUROXime (ZINACEF) 1.5 g in dextrose 5 % 50 mL IVPB  1.5 g Intravenous To OR Kerin Perna, MD      . Melene Muller ON 09/24/2016] cefUROXime (ZINACEF) 750 mg in dextrose 5 % 50 mL IVPB  750 mg Intravenous To OR Kerin Perna, MD      . Melene Muller ON 09/24/2016] dexmedetomidine (PRECEDEX) 400 MCG/100ML (4 mcg/mL) infusion  0.1-0.7 mcg/kg/hr Intravenous To OR Kerin Perna, MD      . Melene Muller ON 09/24/2016] DOPamine (INTROPIN) 800 mg in dextrose 5 % 250 mL (3.2 mg/mL) infusion  0-10 mcg/kg/min Intravenous To OR Kerin Perna, MD      . Melene Muller ON 09/24/2016] EPINEPHrine (ADRENALIN) 4 mg in dextrose 5 % 250  mL (0.016 mg/mL) infusion  0-10 mcg/min Intravenous To OR Kerin Perna, MD      . Melene Muller ON 09/24/2016] heparin 2,500 Units, papaverine 30 mg in electrolyte-148 (PLASMALYTE-148) 500 mL irrigation   Irrigation To OR Kerin Perna, MD      . Melene Muller ON 09/24/2016] heparin 30,000 units/NS 1000 mL solution for CELLSAVER   Other To OR Kerin Perna, MD      . Melene Muller ON 09/24/2016] insulin regular (NOVOLIN R,HUMULIN R) 250 Units in sodium chloride 0.9 % 250 mL (1 Units/mL) infusion   Intravenous To OR Kerin Perna, MD      . Melene Muller ON 09/24/2016] magnesium sulfate (IV Push/IM) injection 40 mEq  40 mEq Other To OR Kerin Perna, MD      . Melene Muller  ON 09/24/2016] nitroGLYCERIN 50 mg in dextrose 5 % 250 mL (0.2 mg/mL) infusion  2-200 mcg/min Intravenous To OR Kerin Perna, MD      . Melene Muller ON 09/24/2016] phenylephrine (NEO-SYNEPHRINE) 20 mg in sodium chloride 0.9 % 250 mL (0.08 mg/mL) infusion  30-200 mcg/min Intravenous To OR Kerin Perna, MD      . Melene Muller ON 09/24/2016] potassium chloride injection 80 mEq  80 mEq Other To OR Kerin Perna, MD      . Melene Muller ON 09/24/2016] tranexamic acid (CYKLOKAPRON) 2,500 mg in sodium chloride 0.9 % 250 mL (10 mg/mL) infusion  1.5 mg/kg/hr Intravenous To OR Kerin Perna, MD      . Melene Muller ON 09/24/2016] tranexamic acid (CYKLOKAPRON) bolus via infusion - over 30 minutes 1,516.5 mg  15 mg/kg Intravenous To OR Kerin Perna, MD      . Melene Muller ON 09/24/2016] tranexamic acid (CYKLOKAPRON) pump prime solution 202 mg  2 mg/kg Intracatheter To OR Kerin Perna, MD      . Melene Muller ON 09/24/2016] vancomycin (VANCOCIN) 1,500 mg in sodium chloride 0.9 % 250 mL IVPB  1,500 mg Intravenous To OR Kerin Perna, MD        Allergies  Allergen Reactions  . Phenergan [Promethazine] Other (See Comments)    SEVERE AGITATION    Review of Systems  Mild problems with hayfever  BP (!) 157/97   Pulse 80   Resp 20   Ht  (1.956 m)   Wt 222 lb (100.7 kg)   SpO2 99% Comment: RA  BMI 26.33 kg/m  Physical Exam      Exam    General- alert and comfortable   Lungs- clear without rales, wheezes   Cor- regular rate and rhythm, no murmur , gallop   Abdomen- soft, non-tender   Extremities - warm, non-tender, minimal edema   Neuro- oriented, appropriate, no focal weakness   Diagnostic Tests: Tests all reviewed with patient  Impression: Patient is ready to proceed with multivessel CABG in a.m. All questions have been addressed and patient is ready to  proceed.    Mikey Bussing, MD [facility] [phone]

## 2016-09-24 ENCOUNTER — Inpatient Hospital Stay (HOSPITAL_COMMUNITY): Payer: Medicare Other

## 2016-09-24 ENCOUNTER — Inpatient Hospital Stay (HOSPITAL_COMMUNITY): Payer: Medicare Other | Admitting: Vascular Surgery

## 2016-09-24 ENCOUNTER — Inpatient Hospital Stay (HOSPITAL_COMMUNITY)
Admission: RE | Disposition: A | Discharge status: Home Health | Payer: Self-pay | Source: Ambulatory Visit | Attending: Cardiothoracic Surgery

## 2016-09-24 ENCOUNTER — Inpatient Hospital Stay (HOSPITAL_COMMUNITY): Payer: Medicare Other | Admitting: Certified Registered Nurse Anesthetist

## 2016-09-24 ENCOUNTER — Inpatient Hospital Stay (HOSPITAL_COMMUNITY)
Admission: RE | Admit: 2016-09-24 | Discharge: 2016-10-01 | DRG: 236 | Disposition: A | Discharge status: Home Health | Payer: Medicare Other | Source: Ambulatory Visit | Attending: Cardiothoracic Surgery | Admitting: Cardiothoracic Surgery

## 2016-09-24 DIAGNOSIS — K219 Gastro-esophageal reflux disease without esophagitis: Secondary | ICD-10-CM | POA: Diagnosis present

## 2016-09-24 DIAGNOSIS — E8770 Fluid overload, unspecified: Secondary | ICD-10-CM | POA: Diagnosis not present

## 2016-09-24 DIAGNOSIS — I2510 Atherosclerotic heart disease of native coronary artery without angina pectoris: Principal | ICD-10-CM | POA: Diagnosis present

## 2016-09-24 DIAGNOSIS — Z955 Presence of coronary angioplasty implant and graft: Secondary | ICD-10-CM | POA: Diagnosis not present

## 2016-09-24 DIAGNOSIS — I480 Paroxysmal atrial fibrillation: Secondary | ICD-10-CM | POA: Diagnosis not present

## 2016-09-24 DIAGNOSIS — Y92239 Unspecified place in hospital as the place of occurrence of the external cause: Secondary | ICD-10-CM | POA: Diagnosis not present

## 2016-09-24 DIAGNOSIS — I9789 Other postprocedural complications and disorders of the circulatory system, not elsewhere classified: Secondary | ICD-10-CM | POA: Diagnosis not present

## 2016-09-24 DIAGNOSIS — Z7982 Long term (current) use of aspirin: Secondary | ICD-10-CM

## 2016-09-24 DIAGNOSIS — R001 Bradycardia, unspecified: Secondary | ICD-10-CM | POA: Diagnosis not present

## 2016-09-24 DIAGNOSIS — E785 Hyperlipidemia, unspecified: Secondary | ICD-10-CM | POA: Diagnosis present

## 2016-09-24 DIAGNOSIS — I252 Old myocardial infarction: Secondary | ICD-10-CM

## 2016-09-24 DIAGNOSIS — J45909 Unspecified asthma, uncomplicated: Secondary | ICD-10-CM | POA: Diagnosis not present

## 2016-09-24 DIAGNOSIS — Y718 Miscellaneous cardiovascular devices associated with adverse incidents, not elsewhere classified: Secondary | ICD-10-CM | POA: Diagnosis not present

## 2016-09-24 DIAGNOSIS — D689 Coagulation defect, unspecified: Secondary | ICD-10-CM | POA: Diagnosis present

## 2016-09-24 DIAGNOSIS — Z79899 Other long term (current) drug therapy: Secondary | ICD-10-CM

## 2016-09-24 DIAGNOSIS — Y832 Surgical operation with anastomosis, bypass or graft as the cause of abnormal reaction of the patient, or of later complication, without mention of misadventure at the time of the procedure: Secondary | ICD-10-CM | POA: Diagnosis not present

## 2016-09-24 DIAGNOSIS — I454 Nonspecific intraventricular block: Secondary | ICD-10-CM | POA: Diagnosis not present

## 2016-09-24 DIAGNOSIS — Z888 Allergy status to other drugs, medicaments and biological substances status: Secondary | ICD-10-CM | POA: Diagnosis not present

## 2016-09-24 DIAGNOSIS — I2584 Coronary atherosclerosis due to calcified coronary lesion: Secondary | ICD-10-CM | POA: Diagnosis present

## 2016-09-24 DIAGNOSIS — I2582 Chronic total occlusion of coronary artery: Secondary | ICD-10-CM | POA: Diagnosis present

## 2016-09-24 DIAGNOSIS — R002 Palpitations: Secondary | ICD-10-CM | POA: Diagnosis present

## 2016-09-24 DIAGNOSIS — I4892 Unspecified atrial flutter: Secondary | ICD-10-CM | POA: Diagnosis not present

## 2016-09-24 DIAGNOSIS — I10 Essential (primary) hypertension: Secondary | ICD-10-CM | POA: Diagnosis not present

## 2016-09-24 DIAGNOSIS — M1990 Unspecified osteoarthritis, unspecified site: Secondary | ICD-10-CM | POA: Diagnosis present

## 2016-09-24 DIAGNOSIS — Z79891 Long term (current) use of opiate analgesic: Secondary | ICD-10-CM

## 2016-09-24 DIAGNOSIS — Z951 Presence of aortocoronary bypass graft: Secondary | ICD-10-CM

## 2016-09-24 DIAGNOSIS — Z7902 Long term (current) use of antithrombotics/antiplatelets: Secondary | ICD-10-CM

## 2016-09-24 HISTORY — PX: CORONARY ARTERY BYPASS GRAFT: SHX141

## 2016-09-24 HISTORY — PX: TEE WITHOUT CARDIOVERSION: SHX5443

## 2016-09-24 LAB — POCT I-STAT, CHEM 8
BUN: 19 mg/dL (ref 6–20)
BUN: 16 mg/dL (ref 6–20)
BUN: 17 mg/dL (ref 6–20)
BUN: 17 mg/dL (ref 6–20)
BUN: 16 mg/dL (ref 6–20)
BUN: 15 mg/dL (ref 6–20)
BUN: 15 mg/dL (ref 6–20)
Calcium, Ion: 1.19 mmol/L (ref 1.15–1.40)
Calcium, Ion: 1.02 mmol/L — ABNORMAL LOW (ref 1.15–1.40)
Calcium, Ion: 1.16 mmol/L (ref 1.15–1.40)
Calcium, Ion: 1.05 mmol/L — ABNORMAL LOW (ref 1.15–1.40)
Calcium, Ion: 1.01 mmol/L — ABNORMAL LOW (ref 1.15–1.40)
Calcium, Ion: 0.95 mmol/L — ABNORMAL LOW (ref 1.15–1.40)
Calcium, Ion: 1.15 mmol/L (ref 1.15–1.40)
Chloride: 104 mmol/L (ref 101–111)
Chloride: 100 mmol/L — ABNORMAL LOW (ref 101–111)
Chloride: 104 mmol/L (ref 101–111)
Chloride: 102 mmol/L (ref 101–111)
Chloride: 103 mmol/L (ref 101–111)
Chloride: 103 mmol/L (ref 101–111)
Chloride: 105 mmol/L (ref 101–111)
Creatinine, Ser: 0.7 mg/dL (ref 0.61–1.24)
Creatinine, Ser: 0.6 mg/dL — ABNORMAL LOW (ref 0.61–1.24)
Creatinine, Ser: 0.7 mg/dL (ref 0.61–1.24)
Creatinine, Ser: 0.8 mg/dL (ref 0.61–1.24)
Creatinine, Ser: 0.6 mg/dL — ABNORMAL LOW (ref 0.61–1.24)
Creatinine, Ser: 0.8 mg/dL (ref 0.61–1.24)
Creatinine, Ser: 0.8 mg/dL (ref 0.61–1.24)
Glucose, Bld: 111 mg/dL — ABNORMAL HIGH (ref 65–99)
Glucose, Bld: 123 mg/dL — ABNORMAL HIGH (ref 65–99)
Glucose, Bld: 135 mg/dL — ABNORMAL HIGH (ref 65–99)
Glucose, Bld: 148 mg/dL — ABNORMAL HIGH (ref 65–99)
Glucose, Bld: 141 mg/dL — ABNORMAL HIGH (ref 65–99)
Glucose, Bld: 130 mg/dL — ABNORMAL HIGH (ref 65–99)
Glucose, Bld: 145 mg/dL — ABNORMAL HIGH (ref 65–99)
HCT: 32 % — ABNORMAL LOW (ref 39.0–52.0)
HCT: 25 % — ABNORMAL LOW (ref 39.0–52.0)
HCT: 28 % — ABNORMAL LOW (ref 39.0–52.0)
HCT: 26 % — ABNORMAL LOW (ref 39.0–52.0)
HCT: 26 % — ABNORMAL LOW (ref 39.0–52.0)
HCT: 22 % — ABNORMAL LOW (ref 39.0–52.0)
HCT: 28 % — ABNORMAL LOW (ref 39.0–52.0)
Hemoglobin: 10.9 g/dL — ABNORMAL LOW (ref 13.0–17.0)
Hemoglobin: 8.5 g/dL — ABNORMAL LOW (ref 13.0–17.0)
Hemoglobin: 9.5 g/dL — ABNORMAL LOW (ref 13.0–17.0)
Hemoglobin: 8.8 g/dL — ABNORMAL LOW (ref 13.0–17.0)
Hemoglobin: 8.8 g/dL — ABNORMAL LOW (ref 13.0–17.0)
Hemoglobin: 7.5 g/dL — ABNORMAL LOW (ref 13.0–17.0)
Hemoglobin: 9.5 g/dL — ABNORMAL LOW (ref 13.0–17.0)
Potassium: 4 mmol/L (ref 3.5–5.1)
Potassium: 4 mmol/L (ref 3.5–5.1)
Potassium: 4.1 mmol/L (ref 3.5–5.1)
Potassium: 4.9 mmol/L (ref 3.5–5.1)
Potassium: 4.8 mmol/L (ref 3.5–5.1)
Potassium: 3.6 mmol/L (ref 3.5–5.1)
Potassium: 3.9 mmol/L (ref 3.5–5.1)
Sodium: 139 mmol/L (ref 135–145)
Sodium: 136 mmol/L (ref 135–145)
Sodium: 138 mmol/L (ref 135–145)
Sodium: 135 mmol/L (ref 135–145)
Sodium: 136 mmol/L (ref 135–145)
Sodium: 139 mmol/L (ref 135–145)
Sodium: 139 mmol/L (ref 135–145)
TCO2: 29 mmol/L (ref 0–100)
TCO2: 28 mmol/L (ref 0–100)
TCO2: 30 mmol/L (ref 0–100)
TCO2: 29 mmol/L (ref 0–100)
TCO2: 28 mmol/L (ref 0–100)
TCO2: 25 mmol/L (ref 0–100)
TCO2: 25 mmol/L (ref 0–100)

## 2016-09-24 LAB — DIC (DISSEMINATED INTRAVASCULAR COAGULATION)PANEL
D-Dimer, Quant: 0.5 ug/mL-FEU (ref 0.00–0.50)
Fibrinogen: 213 mg/dL (ref 210–475)
INR: 1.34
Platelets: 121 10*3/uL — ABNORMAL LOW (ref 150–400)
Prothrombin Time: 16.7 seconds — ABNORMAL HIGH (ref 11.4–15.2)
Smear Review: NONE SEEN
aPTT: 32 seconds (ref 24–36)

## 2016-09-24 LAB — CBC
HCT: 30.3 % — ABNORMAL LOW (ref 39.0–52.0)
HCT: 30.6 % — ABNORMAL LOW (ref 39.0–52.0)
Hemoglobin: 10.3 g/dL — ABNORMAL LOW (ref 13.0–17.0)
Hemoglobin: 10.6 g/dL — ABNORMAL LOW (ref 13.0–17.0)
MCH: 29.3 pg (ref 26.0–34.0)
MCH: 29.5 pg (ref 26.0–34.0)
MCHC: 34 g/dL (ref 30.0–36.0)
MCHC: 34.6 g/dL (ref 30.0–36.0)
MCV: 86.3 fL (ref 78.0–100.0)
MCV: 85.2 fL (ref 78.0–100.0)
PLATELETS: 113 10*3/uL — AB (ref 150–400)
Platelets: 113 10*3/uL — ABNORMAL LOW (ref 150–400)
RBC: 3.51 MIL/uL — AB (ref 4.22–5.81)
RBC: 3.59 MIL/uL — ABNORMAL LOW (ref 4.22–5.81)
RDW: 13.3 % (ref 11.5–15.5)
RDW: 13.3 % (ref 11.5–15.5)
WBC: 11.8 10*3/uL — ABNORMAL HIGH (ref 4.0–10.5)
WBC: 10.8 10*3/uL — ABNORMAL HIGH (ref 4.0–10.5)

## 2016-09-24 LAB — POCT I-STAT 4, (NA,K, GLUC, HGB,HCT)
Glucose, Bld: 113 mg/dL — ABNORMAL HIGH (ref 65–99)
HCT: 29 % — ABNORMAL LOW (ref 39.0–52.0)
Hemoglobin: 9.9 g/dL — ABNORMAL LOW (ref 13.0–17.0)
Potassium: 3.8 mmol/L (ref 3.5–5.1)
Sodium: 141 mmol/L (ref 135–145)

## 2016-09-24 LAB — POCT I-STAT 3, ART BLOOD GAS (G3+)
Acid-Base Excess: 5 mmol/L — ABNORMAL HIGH (ref 0.0–2.0)
Acid-Base Excess: 1 mmol/L (ref 0.0–2.0)
Acid-base deficit: 1 mmol/L (ref 0.0–2.0)
Acid-base deficit: 2 mmol/L (ref 0.0–2.0)
Acid-base deficit: 2 mmol/L (ref 0.0–2.0)
Bicarbonate: 23.4 mmol/L (ref 20.0–28.0)
Bicarbonate: 23.7 mmol/L (ref 20.0–28.0)
Bicarbonate: 23.8 mmol/L (ref 20.0–28.0)
Bicarbonate: 24.6 mmol/L (ref 20.0–28.0)
Bicarbonate: 29.1 mmol/L — ABNORMAL HIGH (ref 20.0–28.0)
O2 Saturation: 100 %
O2 Saturation: 94 %
O2 Saturation: 98 %
O2 Saturation: 99 %
O2 Saturation: 99 %
Patient temperature: 35.3
Patient temperature: 36.7
Patient temperature: 37.2
TCO2: 25 mmol/L (ref 0–100)
TCO2: 25 mmol/L (ref 0–100)
TCO2: 25 mmol/L (ref 0–100)
TCO2: 26 mmol/L (ref 0–100)
TCO2: 30 mmol/L (ref 0–100)
pCO2 arterial: 33.4 mmHg (ref 32.0–48.0)
pCO2 arterial: 38.2 mmHg (ref 32.0–48.0)
pCO2 arterial: 39.2 mmHg (ref 32.0–48.0)
pCO2 arterial: 42.3 mmHg (ref 32.0–48.0)
pCO2 arterial: 42.9 mmHg (ref 32.0–48.0)
pH, Arterial: 7.35 (ref 7.350–7.450)
pH, Arterial: 7.354 (ref 7.350–7.450)
pH, Arterial: 7.401 (ref 7.350–7.450)
pH, Arterial: 7.468 — ABNORMAL HIGH (ref 7.350–7.450)
pH, Arterial: 7.478 — ABNORMAL HIGH (ref 7.350–7.450)
pO2, Arterial: 106 mmHg (ref 83.0–108.0)
pO2, Arterial: 136 mmHg — ABNORMAL HIGH (ref 83.0–108.0)
pO2, Arterial: 166 mmHg — ABNORMAL HIGH (ref 83.0–108.0)
pO2, Arterial: 408 mmHg — ABNORMAL HIGH (ref 83.0–108.0)
pO2, Arterial: 76 mmHg — ABNORMAL LOW (ref 83.0–108.0)

## 2016-09-24 LAB — BLOOD GAS, ARTERIAL
Acid-Base Excess: 2.3 mmol/L — ABNORMAL HIGH (ref 0.0–2.0)
Bicarbonate: 26.3 mmol/L (ref 20.0–28.0)
Drawn by: 470591
FIO2: 21
O2 Saturation: 98.8 %
Patient temperature: 98.6
pCO2 arterial: 40.9 mmHg (ref 32.0–48.0)
pH, Arterial: 7.424 (ref 7.350–7.450)
pO2, Arterial: 131 mmHg — ABNORMAL HIGH (ref 83.0–108.0)

## 2016-09-24 LAB — APTT: APTT: 32 s (ref 24–36)

## 2016-09-24 LAB — PREPARE RBC (CROSSMATCH)

## 2016-09-24 LAB — PLATELET COUNT: Platelets: 102 10*3/uL — ABNORMAL LOW (ref 150–400)

## 2016-09-24 LAB — GLUCOSE, CAPILLARY
Glucose-Capillary: 118 mg/dL — ABNORMAL HIGH (ref 65–99)
Glucose-Capillary: 110 mg/dL — ABNORMAL HIGH (ref 65–99)
Glucose-Capillary: 109 mg/dL — ABNORMAL HIGH (ref 65–99)
Glucose-Capillary: 106 mg/dL — ABNORMAL HIGH (ref 65–99)
Glucose-Capillary: 115 mg/dL — ABNORMAL HIGH (ref 65–99)
Glucose-Capillary: 135 mg/dL — ABNORMAL HIGH (ref 65–99)
Glucose-Capillary: 147 mg/dL — ABNORMAL HIGH (ref 65–99)
Glucose-Capillary: 144 mg/dL — ABNORMAL HIGH (ref 65–99)

## 2016-09-24 LAB — PROTIME-INR
INR: 1.36
Prothrombin Time: 16.9 seconds — ABNORMAL HIGH (ref 11.4–15.2)

## 2016-09-24 LAB — HEMOGLOBIN AND HEMATOCRIT, BLOOD
HCT: 25.9 % — ABNORMAL LOW (ref 39.0–52.0)
Hemoglobin: 9 g/dL — ABNORMAL LOW (ref 13.0–17.0)

## 2016-09-24 LAB — CREATININE, SERUM
Creatinine, Ser: 0.89 mg/dL (ref 0.61–1.24)
GFR calc Af Amer: >60 mL/min (ref >60)
GFR calc non Af Amer: >60 mL/min (ref >60)

## 2016-09-24 LAB — MAGNESIUM: Magnesium: 2.4 mg/dL (ref 1.7–2.4)

## 2016-09-24 SURGERY — CORONARY ARTERY BYPASS GRAFTING (CABG)
Anesthesia: General | Site: Chest

## 2016-09-24 MED ORDER — CALCIUM CHLORIDE 10 % IV SOLN
INTRAVENOUS | Status: AC
  Filled 2016-09-24: qty 10

## 2016-09-24 MED ORDER — SODIUM CHLORIDE 0.9 % IV SOLN: INTRAVENOUS | Status: DC

## 2016-09-24 MED ORDER — ALBUMIN HUMAN 5 % IV SOLN
INTRAVENOUS | Status: DC | PRN
  Administered 2016-09-24: 13:00:00 via INTRAVENOUS

## 2016-09-24 MED ORDER — FENTANYL CITRATE (PF) 250 MCG/5ML IJ SOLN
INTRAMUSCULAR | Status: AC
  Filled 2016-09-24: qty 20

## 2016-09-24 MED ORDER — SODIUM CHLORIDE 0.9 % IV SOLN
20.0000 ug | Freq: Once | INTRAVENOUS | Status: AC
  Administered 2016-09-24: 20 ug via INTRAVENOUS
  Filled 2016-09-24: qty 5

## 2016-09-24 MED ORDER — ONDANSETRON HCL 4 MG/2ML IJ SOLN
INTRAMUSCULAR | Status: AC
  Filled 2016-09-24: qty 2

## 2016-09-24 MED ORDER — NITROGLYCERIN IN D5W 200-5 MCG/ML-% IV SOLN
0.0000 ug/min | INTRAVENOUS | Status: DC
  Administered 2016-09-25: 90 ug/min via INTRAVENOUS
  Administered 2016-09-25: 60 ug/min via INTRAVENOUS
  Filled 2016-09-24 (×2): qty 250

## 2016-09-24 MED ORDER — ORAL CARE MOUTH RINSE
15.0000 mL | OROMUCOSAL | Status: DC
  Administered 2016-09-24 – 2016-09-25 (×6): 15 mL via OROMUCOSAL

## 2016-09-24 MED ORDER — MORPHINE SULFATE (PF) 2 MG/ML IV SOLN: 1.0000 mg | INTRAVENOUS | Status: AC | PRN

## 2016-09-24 MED ORDER — HEPARIN SODIUM (PORCINE) 1000 UNIT/ML IJ SOLN
INTRAMUSCULAR | Status: AC
  Filled 2016-09-24: qty 2

## 2016-09-24 MED ORDER — INSULIN REGULAR HUMAN 100 UNIT/ML IJ SOLN
INTRAMUSCULAR | Status: DC
  Administered 2016-09-24: 2 [IU]/h via INTRAVENOUS
  Filled 2016-09-24: qty 2.5

## 2016-09-24 MED ORDER — ONDANSETRON HCL 4 MG/2ML IJ SOLN
4.0000 mg | Freq: Four times a day (QID) | INTRAMUSCULAR | Status: DC | PRN
Start: 2016-09-24 — End: 2016-10-01

## 2016-09-24 MED ORDER — PHENYLEPHRINE 40 MCG/ML (10ML) SYRINGE FOR IV PUSH (FOR BLOOD PRESSURE SUPPORT)
PREFILLED_SYRINGE | INTRAVENOUS | Status: AC
  Filled 2016-09-24: qty 10

## 2016-09-24 MED ORDER — MORPHINE SULFATE (PF) 2 MG/ML IV SOLN: 2.0000 mg | INTRAVENOUS | Status: DC | PRN

## 2016-09-24 MED ORDER — SODIUM CHLORIDE 0.9 % IV SOLN
Freq: Once | INTRAVENOUS | Status: AC
  Administered 2016-09-24: 09:00:00 via INTRAVENOUS

## 2016-09-24 MED ORDER — HEPARIN SODIUM (PORCINE) 1000 UNIT/ML IJ SOLN
INTRAMUSCULAR | Status: DC | PRN
  Administered 2016-09-24: 3000 [IU] via INTRAVENOUS
  Administered 2016-09-24: 42000 [IU] via INTRAVENOUS

## 2016-09-24 MED ORDER — ASPIRIN 81 MG PO CHEW: 324.0000 mg | CHEWABLE_TABLET | Freq: Every day | ORAL | Status: DC

## 2016-09-24 MED ORDER — MILRINONE LACTATE IN DEXTROSE 20-5 MG/100ML-% IV SOLN
INTRAVENOUS | Status: DC | PRN
  Administered 2016-09-24: 0.25 ug/kg/min via INTRAVENOUS

## 2016-09-24 MED ORDER — LACTATED RINGERS IV SOLN
INTRAVENOUS | Status: DC
  Administered 2016-09-24 (×2): via INTRAVENOUS

## 2016-09-24 MED ORDER — PROPOFOL 10 MG/ML IV BOLUS
INTRAVENOUS | Status: AC
  Filled 2016-09-24: qty 20

## 2016-09-24 MED ORDER — 0.9 % SODIUM CHLORIDE (POUR BTL) OPTIME
TOPICAL | Status: DC | PRN
  Administered 2016-09-24: 6000 mL

## 2016-09-24 MED ORDER — CHLORHEXIDINE GLUCONATE 4 % EX LIQD: 30.0000 mL | CUTANEOUS | Status: DC

## 2016-09-24 MED ORDER — MIDAZOLAM HCL 10 MG/2ML IJ SOLN
INTRAMUSCULAR | Status: AC
  Filled 2016-09-24: qty 2

## 2016-09-24 MED ORDER — TRAMADOL HCL 50 MG PO TABS
50.0000 mg | ORAL_TABLET | ORAL | Status: DC | PRN
  Administered 2016-09-25 (×3): 100 mg via ORAL
  Filled 2016-09-24 (×4): qty 2

## 2016-09-24 MED ORDER — VANCOMYCIN HCL IN DEXTROSE 1-5 GM/200ML-% IV SOLN
1000.0000 mg | Freq: Two times a day (BID) | INTRAVENOUS | Status: AC
  Administered 2016-09-24 – 2016-09-25 (×3): 1000 mg via INTRAVENOUS
  Filled 2016-09-24 (×3): qty 200

## 2016-09-24 MED ORDER — SODIUM CHLORIDE 0.9% FLUSH
3.0000 mL | Freq: Two times a day (BID) | INTRAVENOUS | Status: DC
  Administered 2016-09-25 – 2016-09-26 (×4): 3 mL via INTRAVENOUS

## 2016-09-24 MED ORDER — MORPHINE SULFATE (PF) 4 MG/ML IV SOLN: 2.0000 mg | INTRAVENOUS | Status: DC | PRN

## 2016-09-24 MED ORDER — VANCOMYCIN HCL IN DEXTROSE 1-5 GM/200ML-% IV SOLN
1000.0000 mg | Freq: Once | INTRAVENOUS | Status: DC
  Filled 2016-09-24: qty 200

## 2016-09-24 MED ORDER — FENTANYL CITRATE (PF) 250 MCG/5ML IJ SOLN
INTRAMUSCULAR | Status: AC
  Filled 2016-09-24: qty 5

## 2016-09-24 MED ORDER — FENTANYL CITRATE (PF) 250 MCG/5ML IJ SOLN
INTRAMUSCULAR | Status: DC | PRN
  Administered 2016-09-24: 400 ug via INTRAVENOUS
  Administered 2016-09-24: 50 ug via INTRAVENOUS
  Administered 2016-09-24 (×2): 150 ug via INTRAVENOUS
  Administered 2016-09-24 (×2): 100 ug via INTRAVENOUS
  Administered 2016-09-24: 50 ug via INTRAVENOUS
  Administered 2016-09-24: 100 ug via INTRAVENOUS
  Administered 2016-09-24: 150 ug via INTRAVENOUS

## 2016-09-24 MED ORDER — DEXTROSE 5 % IV SOLN
1.5000 g | Freq: Two times a day (BID) | INTRAVENOUS | Status: AC
  Administered 2016-09-24 – 2016-09-26 (×4): 1.5 g via INTRAVENOUS
  Filled 2016-09-24 (×4): qty 1.5

## 2016-09-24 MED ORDER — METOCLOPRAMIDE HCL 5 MG/ML IJ SOLN
10.0000 mg | Freq: Four times a day (QID) | INTRAMUSCULAR | Status: DC
  Administered 2016-09-24 – 2016-09-28 (×15): 10 mg via INTRAVENOUS
  Filled 2016-09-24 (×14): qty 2

## 2016-09-24 MED ORDER — MAGNESIUM SULFATE 4 GM/100ML IV SOLN
4.0000 g | Freq: Once | INTRAVENOUS | Status: AC
  Administered 2016-09-24: 4 g via INTRAVENOUS
  Filled 2016-09-24: qty 100

## 2016-09-24 MED ORDER — MIDAZOLAM HCL 2 MG/2ML IJ SOLN
2.0000 mg | INTRAMUSCULAR | Status: AC | PRN
Start: 2016-09-24 — End: 2016-09-27

## 2016-09-24 MED ORDER — CHLORHEXIDINE GLUCONATE 0.12 % MT SOLN
15.0000 mL | OROMUCOSAL | Status: AC
  Administered 2016-09-24: 15 mL via OROMUCOSAL

## 2016-09-24 MED ORDER — SODIUM CHLORIDE 0.9 % IV SOLN
0.0000 ug/kg/h | INTRAVENOUS | Status: DC
  Filled 2016-09-24: qty 2

## 2016-09-24 MED ORDER — SODIUM CHLORIDE 0.9 % IV SOLN
30.0000 meq | Freq: Once | INTRAVENOUS | Status: AC
  Administered 2016-09-24: 30 meq via INTRAVENOUS
  Filled 2016-09-24: qty 15

## 2016-09-24 MED ORDER — PROTAMINE SULFATE 10 MG/ML IV SOLN
INTRAVENOUS | Status: AC
  Filled 2016-09-24: qty 50

## 2016-09-24 MED ORDER — VECURONIUM BROMIDE 10 MG IV SOLR
INTRAVENOUS | Status: AC
  Filled 2016-09-24: qty 20

## 2016-09-24 MED ORDER — CHLORHEXIDINE GLUCONATE 0.12% ORAL RINSE (MEDLINE KIT)
15.0000 mL | Freq: Two times a day (BID) | OROMUCOSAL | Status: DC
  Administered 2016-09-24: 15 mL via OROMUCOSAL

## 2016-09-24 MED ORDER — SODIUM CHLORIDE 0.9 % IJ SOLN
INTRAMUSCULAR | Status: DC | PRN
  Administered 2016-09-24: 4 mL via TOPICAL

## 2016-09-24 MED ORDER — GLYCOPYRROLATE 0.2 MG/ML IJ SOLN
INTRAMUSCULAR | Status: DC | PRN
  Administered 2016-09-24: 0.1 mg via INTRAVENOUS
  Administered 2016-09-24: 0.2 mg via INTRAVENOUS
  Administered 2016-09-24: 0.1 mg via INTRAVENOUS

## 2016-09-24 MED ORDER — ACETAMINOPHEN 500 MG PO TABS
1000.0000 mg | ORAL_TABLET | Freq: Four times a day (QID) | ORAL | Status: DC
  Administered 2016-09-25 – 2016-09-27 (×10): 1000 mg via ORAL
  Filled 2016-09-24 (×11): qty 2

## 2016-09-24 MED ORDER — LACTATED RINGERS IV SOLN: 500.0000 mL | Freq: Once | INTRAVENOUS | Status: DC | PRN

## 2016-09-24 MED ORDER — VECURONIUM BROMIDE 10 MG IV SOLR: INTRAVENOUS | Status: DC | PRN

## 2016-09-24 MED ORDER — SODIUM CHLORIDE 0.9 % IV SOLN: Freq: Once | INTRAVENOUS | Status: DC

## 2016-09-24 MED ORDER — METOPROLOL TARTRATE 12.5 MG HALF TABLET
12.5000 mg | ORAL_TABLET | Freq: Two times a day (BID) | ORAL | Status: DC
  Administered 2016-09-25: 12.5 mg via ORAL
  Filled 2016-09-24: qty 1

## 2016-09-24 MED ORDER — LACTATED RINGERS IV SOLN: INTRAVENOUS | Status: DC

## 2016-09-24 MED ORDER — METOPROLOL TARTRATE 12.5 MG HALF TABLET: 12.5000 mg | ORAL_TABLET | Freq: Once | ORAL | Status: DC

## 2016-09-24 MED ORDER — BISACODYL 10 MG RE SUPP: 10.0000 mg | Freq: Every day | RECTAL | Status: DC

## 2016-09-24 MED ORDER — FAMOTIDINE IN NACL 20-0.9 MG/50ML-% IV SOLN
20.0000 mg | Freq: Two times a day (BID) | INTRAVENOUS | Status: AC
  Administered 2016-09-24 (×2): 20 mg via INTRAVENOUS
  Filled 2016-09-24: qty 50

## 2016-09-24 MED ORDER — HEMOSTATIC AGENTS (NO CHARGE) OPTIME
TOPICAL | Status: DC | PRN
  Administered 2016-09-24 (×4): 1 via TOPICAL

## 2016-09-24 MED ORDER — SODIUM CHLORIDE 0.9 % IV SOLN
0.0000 ug/min | INTRAVENOUS | Status: DC
  Filled 2016-09-24: qty 2

## 2016-09-24 MED ORDER — METOPROLOL TARTRATE 25 MG/10 ML ORAL SUSPENSION: 12.5000 mg | Freq: Two times a day (BID) | ORAL | Status: DC

## 2016-09-24 MED ORDER — DOCUSATE SODIUM 100 MG PO CAPS
200.0000 mg | ORAL_CAPSULE | Freq: Every day | ORAL | Status: DC
Start: 2016-09-25 — End: 2016-10-01
  Administered 2016-09-25 – 2016-10-01 (×6): 200 mg via ORAL
  Filled 2016-09-24 (×6): qty 2

## 2016-09-24 MED ORDER — ACETAMINOPHEN 160 MG/5ML PO SOLN: 650.0000 mg | Freq: Once | ORAL | Status: AC

## 2016-09-24 MED ORDER — TRANEXAMIC ACID 1000 MG/10ML IV SOLN
1000.0000 mg | INTRAVENOUS | Status: DC
  Filled 2016-09-24: qty 10

## 2016-09-24 MED ORDER — LACTATED RINGERS IV SOLN
INTRAVENOUS | Status: DC | PRN
  Administered 2016-09-24: 07:00:00 via INTRAVENOUS

## 2016-09-24 MED ORDER — CHLORHEXIDINE GLUCONATE 0.12 % MT SOLN
15.0000 mL | Freq: Once | OROMUCOSAL | Status: DC
  Filled 2016-09-24: qty 15

## 2016-09-24 MED ORDER — ROCURONIUM BROMIDE 50 MG/5ML IV SOSY
PREFILLED_SYRINGE | INTRAVENOUS | Status: AC
  Filled 2016-09-24: qty 5

## 2016-09-24 MED ORDER — LACTATED RINGERS IV SOLN
INTRAVENOUS | Status: DC | PRN
  Administered 2016-09-24 (×2): via INTRAVENOUS

## 2016-09-24 MED ORDER — ASPIRIN EC 325 MG PO TBEC
325.0000 mg | DELAYED_RELEASE_TABLET | Freq: Every day | ORAL | Status: DC
  Filled 2016-09-24: qty 1

## 2016-09-24 MED ORDER — MORPHINE SULFATE (PF) 4 MG/ML IV SOLN
2.0000 mg | INTRAVENOUS | Status: DC | PRN
  Administered 2016-09-24: 4 mg via INTRAVENOUS
  Administered 2016-09-24: 2 mg via INTRAVENOUS
  Administered 2016-09-25: 4 mg via INTRAVENOUS
  Administered 2016-09-25 (×2): 2 mg via INTRAVENOUS
  Filled 2016-09-24 (×5): qty 1

## 2016-09-24 MED ORDER — SODIUM CHLORIDE 0.45 % IV SOLN
INTRAVENOUS | Status: DC | PRN
  Administered 2016-09-24: 15:00:00 via INTRAVENOUS

## 2016-09-24 MED ORDER — ACETAMINOPHEN 650 MG RE SUPP
650.0000 mg | Freq: Once | RECTAL | Status: AC
  Administered 2016-09-24: 650 mg via RECTAL

## 2016-09-24 MED ORDER — PROPOFOL 10 MG/ML IV BOLUS
INTRAVENOUS | Status: DC | PRN
  Administered 2016-09-24: 50 mg via INTRAVENOUS

## 2016-09-24 MED ORDER — CALCIUM CHLORIDE 10 % IV SOLN
INTRAVENOUS | Status: DC | PRN
  Administered 2016-09-24: 1 g via INTRAVENOUS

## 2016-09-24 MED ORDER — ACETAMINOPHEN 160 MG/5ML PO SOLN: 1000.0000 mg | Freq: Four times a day (QID) | ORAL | Status: DC

## 2016-09-24 MED ORDER — ROCURONIUM BROMIDE 10 MG/ML (PF) SYRINGE
PREFILLED_SYRINGE | INTRAVENOUS | Status: DC | PRN
  Administered 2016-09-24: 30 mg via INTRAVENOUS
  Administered 2016-09-24: 50 mg via INTRAVENOUS

## 2016-09-24 MED ORDER — PROTAMINE SULFATE 10 MG/ML IV SOLN
INTRAVENOUS | Status: DC | PRN
  Administered 2016-09-24: 400 mg via INTRAVENOUS

## 2016-09-24 MED ORDER — OXYCODONE HCL 5 MG PO TABS
5.0000 mg | ORAL_TABLET | ORAL | Status: DC | PRN
  Administered 2016-09-25 – 2016-09-26 (×8): 10 mg via ORAL
  Administered 2016-09-27: 5 mg via ORAL
  Filled 2016-09-24: qty 2
  Filled 2016-09-24: qty 1
  Filled 2016-09-24 (×7): qty 2

## 2016-09-24 MED ORDER — BISACODYL 5 MG PO TBEC
10.0000 mg | DELAYED_RELEASE_TABLET | Freq: Every day | ORAL | Status: DC
  Administered 2016-09-25 – 2016-09-29 (×4): 10 mg via ORAL
  Filled 2016-09-24 (×6): qty 2

## 2016-09-24 MED ORDER — MILRINONE LACTATE IN DEXTROSE 20-5 MG/100ML-% IV SOLN
0.2500 ug/kg/min | INTRAVENOUS | Status: DC
  Administered 2016-09-24: 0.25 ug/kg/min via INTRAVENOUS
  Filled 2016-09-24: qty 100

## 2016-09-24 MED ORDER — MIDAZOLAM HCL 5 MG/5ML IJ SOLN
INTRAMUSCULAR | Status: DC | PRN
  Administered 2016-09-24: 2 mg via INTRAVENOUS
  Administered 2016-09-24: 1 mg via INTRAVENOUS
  Administered 2016-09-24: 2 mg via INTRAVENOUS
  Administered 2016-09-24: 3 mg via INTRAVENOUS
  Administered 2016-09-24: 2 mg via INTRAVENOUS

## 2016-09-24 MED ORDER — PANTOPRAZOLE SODIUM 40 MG PO TBEC
40.0000 mg | DELAYED_RELEASE_TABLET | Freq: Every day | ORAL | Status: DC
  Administered 2016-09-26 – 2016-10-01 (×6): 40 mg via ORAL
  Filled 2016-09-24 (×6): qty 1

## 2016-09-24 MED ORDER — INSULIN REGULAR BOLUS VIA INFUSION
0.0000 [IU] | Freq: Three times a day (TID) | INTRAVENOUS | Status: DC
  Filled 2016-09-24: qty 10

## 2016-09-24 MED ORDER — SODIUM CHLORIDE 0.9% FLUSH: 3.0000 mL | INTRAVENOUS | Status: DC | PRN

## 2016-09-24 MED ORDER — ALBUMIN HUMAN 5 % IV SOLN
250.0000 mL | INTRAVENOUS | Status: AC | PRN
  Administered 2016-09-24 – 2016-09-25 (×4): 250 mL via INTRAVENOUS
  Filled 2016-09-24: qty 250

## 2016-09-24 MED ORDER — MILRINONE LACTATE IN DEXTROSE 20-5 MG/100ML-% IV SOLN
0.1250 ug/kg/min | INTRAVENOUS | Status: DC
  Filled 2016-09-24: qty 100

## 2016-09-24 MED ORDER — SODIUM CHLORIDE 0.9 % IV SOLN: 250.0000 mL | INTRAVENOUS | Status: DC

## 2016-09-24 MED ORDER — VECURONIUM BROMIDE 10 MG IV SOLR
INTRAVENOUS | Status: DC | PRN
  Administered 2016-09-24 (×2): 5 mg via INTRAVENOUS
  Administered 2016-09-24: 4 mg via INTRAVENOUS
  Administered 2016-09-24: 6 mg via INTRAVENOUS

## 2016-09-24 MED ORDER — METOPROLOL TARTRATE 5 MG/5ML IV SOLN
2.5000 mg | INTRAVENOUS | Status: DC | PRN
  Administered 2016-09-28: 5 mg via INTRAVENOUS
  Administered 2016-09-29: 2.5 mg via INTRAVENOUS
  Filled 2016-09-24 (×2): qty 5

## 2016-09-24 MED FILL — Magnesium Sulfate Inj 50%: INTRAMUSCULAR | Qty: 2 | Status: AC

## 2016-09-24 MED FILL — Heparin Sodium (Porcine) Inj 1000 Unit/ML: INTRAMUSCULAR | Qty: 30 | Status: AC

## 2016-09-24 MED FILL — Potassium Chloride Inj 2 mEq/ML: INTRAVENOUS | Qty: 40 | Status: AC

## 2016-09-24 SURGICAL SUPPLY — 107 items
ADAPTER CARDIO PERF ANTE/RETRO (ADAPTER) ×4 IMPLANT
ADPR PRFSN 84XANTGRD RTRGD (ADAPTER) ×2
BAG DECANTER FOR FLEXI CONT (MISCELLANEOUS) ×4 IMPLANT
BANDAGE ACE 4X5 VEL STRL LF (GAUZE/BANDAGES/DRESSINGS) ×4 IMPLANT
BANDAGE ACE 6X5 VEL STRL LF (GAUZE/BANDAGES/DRESSINGS) ×4 IMPLANT
BASKET HEART  (ORDER IN 25'S) (MISCELLANEOUS) ×1
BASKET HEART (ORDER IN 25'S) (MISCELLANEOUS) ×1
BASKET HEART (ORDER IN 25S) (MISCELLANEOUS) ×2 IMPLANT
BLADE CLIPPER SURG (BLADE) IMPLANT
BLADE STERNUM SYSTEM 6 (BLADE) ×4 IMPLANT
BLADE SURG 12 STRL SS (BLADE) ×4 IMPLANT
BNDG GAUZE ELAST 4 BULKY (GAUZE/BANDAGES/DRESSINGS) ×4 IMPLANT
CANISTER SUCT 3000ML PPV (MISCELLANEOUS) ×4 IMPLANT
CANNULA GUNDRY RCSP 15FR (MISCELLANEOUS) ×4 IMPLANT
CATH CPB KIT VANTRIGT (MISCELLANEOUS) ×4 IMPLANT
CATH ROBINSON RED A/P 18FR (CATHETERS) ×12 IMPLANT
CATH THORACIC 36FR RT ANG (CATHETERS) ×4 IMPLANT
CLIP FOGARTY SPRING 6M (CLIP) ×2 IMPLANT
CLIP RETRACTION 3.0MM CORONARY (MISCELLANEOUS) ×2 IMPLANT
CLIP TI WIDE RED SMALL 24 (CLIP) ×2 IMPLANT
CRADLE DONUT ADULT HEAD (MISCELLANEOUS) ×4 IMPLANT
DRAIN CHANNEL 32F RND 10.7 FF (WOUND CARE) ×4 IMPLANT
DRAPE CARDIOVASCULAR INCISE (DRAPES) ×4
DRAPE SLUSH/WARMER DISC (DRAPES) ×4 IMPLANT
DRAPE SRG 135X102X78XABS (DRAPES) ×2 IMPLANT
DRSG AQUACEL AG ADV 3.5X14 (GAUZE/BANDAGES/DRESSINGS) ×4 IMPLANT
ELECT BLADE 4.0 EZ CLEAN MEGAD (MISCELLANEOUS) ×4
ELECT BLADE 6.5 EXT (BLADE) ×4 IMPLANT
ELECT CAUTERY BLADE 6.4 (BLADE) ×4 IMPLANT
ELECT REM PT RETURN 9FT ADLT (ELECTROSURGICAL) ×8
ELECTRODE BLDE 4.0 EZ CLN MEGD (MISCELLANEOUS) ×2 IMPLANT
ELECTRODE REM PT RTRN 9FT ADLT (ELECTROSURGICAL) ×4 IMPLANT
FELT TEFLON 1X6 (MISCELLANEOUS) ×8 IMPLANT
GAUZE SPONGE 4X4 12PLY STRL (GAUZE/BANDAGES/DRESSINGS) ×8 IMPLANT
GAUZE SPONGE 4X4 12PLY STRL LF (GAUZE/BANDAGES/DRESSINGS) ×6 IMPLANT
GLOVE BIO SURGEON STRL SZ 6 (GLOVE) ×6 IMPLANT
GLOVE BIO SURGEON STRL SZ 6.5 (GLOVE) ×5 IMPLANT
GLOVE BIO SURGEON STRL SZ7.5 (GLOVE) ×20 IMPLANT
GLOVE BIO SURGEONS STRL SZ 6.5 (GLOVE) ×5
GLOVE BIOGEL PI IND STRL 6 (GLOVE) IMPLANT
GLOVE BIOGEL PI IND STRL 6.5 (GLOVE) IMPLANT
GLOVE BIOGEL PI INDICATOR 6 (GLOVE) ×8
GLOVE BIOGEL PI INDICATOR 6.5 (GLOVE) ×8
GOWN STRL REUS W/ TWL LRG LVL3 (GOWN DISPOSABLE) ×8 IMPLANT
GOWN STRL REUS W/TWL LRG LVL3 (GOWN DISPOSABLE) ×40
HEMOSTAT POWDER SURGIFOAM 1G (HEMOSTASIS) ×16 IMPLANT
HEMOSTAT SURGICEL 2X14 (HEMOSTASIS) ×4 IMPLANT
INSERT FOGARTY XLG (MISCELLANEOUS) IMPLANT
KIT BASIN OR (CUSTOM PROCEDURE TRAY) ×4 IMPLANT
KIT ROOM TURNOVER OR (KITS) ×4 IMPLANT
KIT SUCTION CATH 14FR (SUCTIONS) ×4 IMPLANT
KIT VASOVIEW HEMOPRO VH 3000 (KITS) ×4 IMPLANT
LEAD PACING MYOCARDI (MISCELLANEOUS) ×4 IMPLANT
MARKER GRAFT CORONARY BYPASS (MISCELLANEOUS) ×12 IMPLANT
NS IRRIG 1000ML POUR BTL (IV SOLUTION) ×22 IMPLANT
PACK OPEN HEART (CUSTOM PROCEDURE TRAY) ×4 IMPLANT
PAD ARMBOARD 7.5X6 YLW CONV (MISCELLANEOUS) ×8 IMPLANT
PAD ELECT DEFIB RADIOL ZOLL (MISCELLANEOUS) ×4 IMPLANT
PENCIL BUTTON HOLSTER BLD 10FT (ELECTRODE) ×4 IMPLANT
POWDER SURGICEL 3.0 GRAM (HEMOSTASIS) ×2 IMPLANT
PUNCH AORTIC ROTATE  4.5MM 8IN (MISCELLANEOUS) ×2 IMPLANT
PUNCH AORTIC ROTATE 4.0MM (MISCELLANEOUS) IMPLANT
PUNCH AORTIC ROTATE 4.5MM 8IN (MISCELLANEOUS) IMPLANT
PUNCH AORTIC ROTATE 5MM 8IN (MISCELLANEOUS) IMPLANT
SET CARDIOPLEGIA MPS 5001102 (MISCELLANEOUS) ×2 IMPLANT
SPONGE LAP 18X18 X RAY DECT (DISPOSABLE) ×4 IMPLANT
SPONGE LAP 4X18 X RAY DECT (DISPOSABLE) ×2 IMPLANT
SURGIFLO W/THROMBIN 8M KIT (HEMOSTASIS) ×6 IMPLANT
SUT BONE WAX W31G (SUTURE) ×4 IMPLANT
SUT MNCRL AB 4-0 PS2 18 (SUTURE) IMPLANT
SUT PROLENE 3 0 SH DA (SUTURE) IMPLANT
SUT PROLENE 3 0 SH1 36 (SUTURE) IMPLANT
SUT PROLENE 4 0 RB 1 (SUTURE) ×4
SUT PROLENE 4 0 SH DA (SUTURE) ×4 IMPLANT
SUT PROLENE 4-0 RB1 .5 CRCL 36 (SUTURE) ×2 IMPLANT
SUT PROLENE 5 0 C 1 36 (SUTURE) IMPLANT
SUT PROLENE 6 0 C 1 30 (SUTURE) ×18 IMPLANT
SUT PROLENE 6 0 CC (SUTURE) ×14 IMPLANT
SUT PROLENE 8 0 BV175 6 (SUTURE) ×8 IMPLANT
SUT PROLENE BLUE 7 0 (SUTURE) ×6 IMPLANT
SUT SILK  1 MH (SUTURE)
SUT SILK 1 MH (SUTURE) IMPLANT
SUT SILK 2 0 SH CR/8 (SUTURE) IMPLANT
SUT SILK 3 0 SH CR/8 (SUTURE) IMPLANT
SUT STEEL 6MS V (SUTURE) ×6 IMPLANT
SUT STEEL SZ 6 DBL 3X14 BALL (SUTURE) ×6 IMPLANT
SUT VIC AB 1 CTX 36 (SUTURE) ×16
SUT VIC AB 1 CTX36XBRD ANBCTR (SUTURE) ×4 IMPLANT
SUT VIC AB 2-0 CT1 27 (SUTURE) ×4
SUT VIC AB 2-0 CT1 TAPERPNT 27 (SUTURE) IMPLANT
SUT VIC AB 2-0 CTX 27 (SUTURE) IMPLANT
SUT VIC AB 3-0 X1 27 (SUTURE) ×2 IMPLANT
SUTURE E-PAK OPEN HEART (SUTURE) ×4 IMPLANT
SYSTEM SAHARA CHEST DRAIN ATS (WOUND CARE) ×4 IMPLANT
TAPE CLOTH SURG 4X10 WHT LF (GAUZE/BANDAGES/DRESSINGS) ×4 IMPLANT
TAPE PAPER 2X10 WHT MICROPORE (GAUZE/BANDAGES/DRESSINGS) ×2 IMPLANT
TOWEL GREEN STERILE (TOWEL DISPOSABLE) ×9 IMPLANT
TOWEL GREEN STERILE FF (TOWEL DISPOSABLE) ×4 IMPLANT
TOWEL OR 17X24 6PK STRL BLUE (TOWEL DISPOSABLE) ×4 IMPLANT
TOWEL OR 17X26 10 PK STRL BLUE (TOWEL DISPOSABLE) ×4 IMPLANT
TRAY FOLEY SILVER 16FR TEMP (SET/KITS/TRAYS/PACK) ×4 IMPLANT
TUBE CONNECTING 12'X1/4 (SUCTIONS) ×1
TUBE CONNECTING 12X1/4 (SUCTIONS) ×1 IMPLANT
TUBING INSUFFLATION (TUBING) ×4 IMPLANT
UNDERPAD 30X30 (UNDERPADS AND DIAPERS) ×4 IMPLANT
WATER STERILE IRR 1000ML POUR (IV SOLUTION) ×8 IMPLANT
YANKAUER SUCT BULB TIP NO VENT (SUCTIONS) ×2 IMPLANT

## 2016-09-24 NOTE — H&P (Signed)
ery  Progress Notes Encounter Date: 09/03/2016     Related encounter: Surgical Consult from 09/03/2016 in [facility] copied text Hover for attribution information PCP is Forrest Moron, MD Referring Provider is Caryl Ada, MD       Chief Complaint  Patient presents with  . 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 At that time the patient was admitted to the [facility] hospital and had urgent PCI of the RCA with 2 drug-eluting stents. He had inferior wall hypokinesia. He had a untoward reaction to Phenergan with altered mental status vomiting and possible aspiration. He was in ICU for approximately 3 days but eventually stabilized and has recovered. He completed cardiac rehabilitation program at [facility].   The patient was noted have residual multivessel disease at the time of the cath in September. His LAD diagonal had 90% stenosis. The ramus intermediate had 70% stenosis in the circumflex was totally occluded. LVEF was 40%.   The patient was seen back by his cardiologist Dr. Rhona Leavens who performed a stress test with echocardiogram. This showed a positive ischemic response. He subsequently underwent cardiac catheterization on March 19. This showed severe stenosis of the LAD, diagonal and ramus with chronic occlusion of he circumflex with collaterals from the right coronary. RCA stents were patent. EF was 35-40 percent. The cardiologist has recommended consideration for surgical coronary revascularization based on his coronary anatomy and positive stress test and reduced LV function. Patient also has  business commitments that require international travel and he is concerned over further cardiac events while out of accessible medical care.   The patient currently works 8-12 hours daily and denies angina fatigue or symptoms of heart failure including PND orthopnea. He feels he has recovered fairly well from his event last September, 6 months ago.   The patient has been on Effient now for 6 months after his PCI without significant bleeding complications     Past Medical History:  Diagnosis Date  . Abnormal stress echo 08/13/2016  . Arthritis    . Asthma    . 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 CAD in native artery    . GERD (gastroesophageal reflux disease)    . History of heart artery stent 02/16/2016  . Hyperlipidemia    . Hypertension    . STEMI (ST elevation myocardial infarction) (HCC) 02/16/2016      No past surgical history on file.   No family history on file.   Social History     Social History  Substance Use Topics  . Smoking status: Never Smoker  . Smokeless tobacco: Never Used  . Alcohol use No            Current Outpatient Prescriptions  Medication Sig Dispense Refill  . ALPRAZolam (XANAX) 0.5 MG tablet Take 0.25-0.5 mg by mouth 3 (three) times daily as needed for sleep.      Marland Kitchen aspirin EC 81 MG tablet Take 81 mg by mouth daily.      Marland Kitchen atorvastatin (LIPITOR) 80 MG tablet Take 80 mg by mouth daily.      Marland Kitchen  esomeprazole (NEXIUM) 20 MG capsule Take 20 mg by mouth daily before breakfast.      . HYDROcodone-acetaminophen (NORCO/VICODIN) 5-325 MG tablet Take 1 tablet by mouth every 3 (three) hours as needed for moderate pain. MORNING/NOON      . lisinopril (PRINIVIL,ZESTRIL) 2.5 MG tablet Take 2.5 mg by mouth daily. TAKE 2 TABLETS DAILY (5 MG)      . metoprolol tartrate (LOPRESSOR) 25 MG tablet Take 25 mg by mouth 2 (two) times daily.      . prasugrel (EFFIENT) 10 MG TABS tablet Take 10 mg by mouth daily.        No current  facility-administered medications for this visit.            Allergies  Allergen Reactions  . Phenergan [Promethazine] Other (See Comments)      SEVERE AGITATION      Review of Systems        Patient's wife has significant dementia    Review of Systems :  [ y ] = yes, [  ] = no         General :  Weight gain [   ]    Weight loss  [   ]  Fatigue [  ]  Fever [  ]  Chills  [  ]                                Weakness  [ mild ]            HEENT    Headache [  ]  Dizziness [  ]  Blurred vision [  ] Glaucoma  [  ]                          Nosebleeds [  ] Painful or loose teeth [  ]         Cardiac :  Chest pain/ pressure [  ]  Resting SOB [  ] exertional SOB [mild  ]                        Orthopnea [  ]  Pedal edema  [  ]  Palpitations [  ] Syncope/presyncope                         Paroxysmal nocturnal dyspnea [  ]          Pulmonary : cough [  ]  wheezing [  ]  Hemoptysis [  ] Sputum [  ] Snoring [  ]                              Pneumothorax [  ]  Sleep apnea [  ]         GI : Vomiting [  ]  Dysphagia [  ]  Melena  [  ]  Abdominal pain [  ] BRBPR [  ]              Heart burn [  ]  Constipation [  ] Diarrhea  [  ] Colonoscopy [  yes ]         GU : Hematuria [  ]  Dysuria [  ]  Nocturia [  ] UTI's [  ]  Vascular : Claudication [  ]  Rest pain [  ]  DVT [  ] Vein stripping [  ] leg ulcers [  ]                          TIA [  ] Stroke [  ]  Varicose veins [  ]         NEURO :  Headaches  [  ] Seizures [  ] Vision changes [  ] Paresthesias [  ]                                       Seizures [  ]         Musculoskeletal :  Arthritis [  ] Gout  [  ]  Back pain [  ]  Joint pain [  ]pain from cervical arthritis         Skin :  Rash [  ]  Melanoma [  ] Sores [  ]         Heme : Bleeding problems [  ]Clotting Disorders [  ] Anemia [  ]Blood Transfusion          Endocrine : Diabetes [  ] Heat or Cold intolerance [  ] Polyuria [  ]excessive thirst          Psych :  Depression [  ]  Anxiety [  ]  Psych hospitalizations [  ] Memory change [  ]                                                 BP (!) 143/82 (BP Location: Left Arm, Patient Position: Sitting, Cuff Size: Large)   Pulse 63   Resp 16   Ht 6' 0.5" (1.842 m)   Wt 214 lb (97.1 kg)   SpO2 97% Comment: ON RA  BMI 28.62 kg/m  Physical Exam     Physical Exam   General: Well-appearing 76 year old Caucasian male no acute distress HEENT: Normocephalic pupils equal , dentition adequate Neck: Supple without JVD, adenopathy, or bruit Chest: Clear to auscultation, symmetrical breath sounds, no rhonchi, no tenderness             or deformity Cardiovascular: Regular rate and rhythm, no murmur, no gallop, peripheral pulses             palpable in all extremities Abdomen:  Soft, nontender, no palpable mass or organomegaly Extremities: Warm, well-perfused, no clubbing cyanosis edema or tenderness,              no venous stasis changes of the legs Rectal/GU: Deferred Neuro: Grossly non--focal and symmetrical throughout Skin: Clean and dry without rash or ulceration     Diagnostic Tests:  most recent coronary catheterization personally reviewed.   the patient has high-grade stenosis of  the LAD- diagonal. The patient has total occlusion of the circumflex with collateralization of small targets from the right coronary The patient has 70-75% stenosis of a large ramus   Impression:  severe three-vessel coronary disease with moderate LV dysfunction and positive-ischemic response to stress test    Plan:I discussed the role of CABG for treatment of the patient's CAD extensively. I  feel the patient would benefit from surgical coronary revascularization with respect to preservation of LV function and improved survival. The details of surgery including the operative procedure and the hospital recovery  werereviewed in detail.   I will speak to the patient's cardiologist Dr. Rhona Leavens and determine when the  Effient can be stopped prior to surgery. The patient is now 6 months after PCI and doing well.   after I discuss the patient with his cardiologist a tentative date for surgery be scheduled in mid April.   Mikey Bussing, MD [facility]  The patient has been evaluated and preop evaluation completed Plan multi vessel CABG today on J St[place]

## 2016-09-24 NOTE — Progress Notes (Signed)
  Echocardiogram Echocardiogram Transesophageal has been performed.  Janalyn Harder 09/24/2016, 10:13 AM

## 2016-09-24 NOTE — Progress Notes (Signed)
Pt placed back on previous settings due to decreased respiratory rate and decreased minute ventilation.  RT will continue to monitor.

## 2016-09-24 NOTE — Anesthesia Postprocedure Evaluation (Addendum)
Anesthesia Post Note  Patient: Gilbert Hutchinson  Procedure(s) Performed: Procedure(s) (LRB): CORONARY ARTERY BYPASS GRAFTING (CABG) x 4 using the left internal mammary artery and the left greater saphenous vein harvested endoscopically. (N/A) TRANSESOPHAGEAL ECHOCARDIOGRAM (TEE) (N/A)  Patient location during evaluation: SICU Anesthesia Type: General Level of consciousness: sedated Pain management: pain level controlled Vital Signs Assessment: post-procedure vital signs reviewed and stable Respiratory status: patient remains intubated per anesthesia plan and patient on ventilator - see flowsheet for VS Cardiovascular status: blood pressure returned to baseline Anesthetic complications: no       Last Vitals:  Vitals:   09/24/16 1945 09/24/16 2000  BP:  96/65  Pulse: 80 75  Resp: 15 14  Temp: 36.3 C 36.3 C    Last Pain:  Vitals:   09/24/16 1945  TempSrc: Core (Comment)                 Sanaz Scarlett COKER

## 2016-09-24 NOTE — Anesthesia Procedure Notes (Signed)
Central Venous Catheter Insertion Performed by: Val Eagle, anesthesiologist Start/End4/17/2018 7:13 AM, 09/24/2016 7:26 AM Patient location: Pre-op. Preanesthetic checklist: patient identified, IV checked, site marked, risks and benefits discussed, surgical consent, monitors and equipment checked, pre-op evaluation, timeout performed and anesthesia consent Lidocaine 1% used for infiltration and patient sedated Hand hygiene performed  and maximum sterile barriers used  Catheter size: 8 Fr Total catheter length 16. Central line was placed.Double lumen Procedure performed using ultrasound guided technique. Ultrasound Notes:image(s) printed for medical record Attempts: 1 Following insertion, dressing applied and line sutured. Post procedure assessment: blood return through all ports, free fluid flow and no air  Patient tolerated the procedure well with no immediate complications.

## 2016-09-24 NOTE — Progress Notes (Signed)
Rt notified of desire to start rapid wean. Modena Jansky RN 2 Heart

## 2016-09-24 NOTE — Transfer of Care (Signed)
Immediate Anesthesia Transfer of Care Note  Patient: Gilbert Hutchinson  Procedure(s) Performed: Procedure(s): CORONARY ARTERY BYPASS GRAFTING (CABG) x 4 using the left internal mammary artery and the left greater saphenous vein harvested endoscopically. (N/A) TRANSESOPHAGEAL ECHOCARDIOGRAM (TEE) (N/A)  Patient Location: SICU  Anesthesia Type:General  Level of Consciousness: Patient remains intubated per anesthesia plan  Airway & Oxygen Therapy: Patient remains intubated per anesthesia plan and Patient placed on Ventilator (see vital sign flow sheet for setting)  Post-op Assessment: Report given to RN and Post -op Vital signs reviewed and stable  Post vital signs: Reviewed and stable  Last Vitals:  Vitals:   09/24/16 0626  BP: (!) 191/93  Pulse: 61  Resp: 18  Temp: 36.9 C    Last Pain:  Vitals:   09/24/16 0626  TempSrc: Oral      Patients Stated Pain Goal: 3 (09/24/16 0601)  Complications: No apparent anesthesia complications

## 2016-09-24 NOTE — Procedures (Signed)
Extubation Procedure Note  Patient Details:   Name: Gilbert Hutchinson DOB: 1941/04/30 MRN: [id number]   Airway Documentation:  Airway 8 mm (Active)  Secured at (cm) 22 cm 09/24/2016  9:41 PM  Measured From Lips 09/24/2016  9:41 PM  Secured Location Right 09/24/2016  9:41 PM  Secured By Caron Presume Tape 09/24/2016  9:41 PM  Site Condition Dry 09/24/2016  4:00 PM    Evaluation  O2 sats: stable throughout Complications: No apparent complications Patient did tolerate procedure well. Bilateral Breath Sounds: Clear   Yes  Letitia Caul [facility] 09/24/2016, 10:48 PM

## 2016-09-24 NOTE — Anesthesia Procedure Notes (Signed)
Procedure Name: Intubation Date/Time: 09/24/2016 8:04 AM Performed by: Rejeana Brock L Pre-anesthesia Checklist: Patient identified, Emergency Drugs available, Suction available and Patient being monitored Patient Re-evaluated:Patient Re-evaluated prior to inductionOxygen Delivery Method: Circle System Utilized Preoxygenation: Pre-oxygenation with 100% oxygen Intubation Type: IV induction Ventilation: Mask ventilation without difficulty Laryngoscope Size: Mac and 4 Grade View: Grade IV Tube type: Oral Tube size: 8.0 mm Number of attempts: 1 Airway Equipment and Method: Stylet and Oral airway Placement Confirmation: ETT inserted through vocal cords under direct vision,  positive ETCO2 and breath sounds checked- equal and bilateral Secured at: 22 cm Tube secured with: Tape Dental Injury: Teeth and Oropharynx as per pre-operative assessment  Difficulty Due To: Difficult Airway- due to reduced neck mobility Future Recommendations: Recommend- induction with short-acting agent, and alternative techniques readily available

## 2016-09-24 NOTE — Brief Op Note (Signed)
09/24/2016  12:12 PM  PATIENT:  Gilbert Hutchinson  76 y.o. male  PRE-OPERATIVE DIAGNOSIS:  CAD  POST-OPERATIVE DIAGNOSIS:  CAD  PROCEDURE:  Procedure(s): CORONARY ARTERY BYPASS GRAFTING (CABG) (N/A) TRANSESOPHAGEAL ECHOCARDIOGRAM (TEE) (N/A) ENDOSCOPIC GREATER SAPHENOUS VEIN HARVEST LEFT LEG LIMA-LAD SVG-DIAG SVG-RAMUS SVG-CX  SURGEON:  Surgeon(s) and Role:    * Kerin Perna, MD - Primary  PHYSICIAN ASSISTANT: Alailah Safley PA-C  ANESTHESIA:   general  EBL:  Total I/O In: 950 [I.V.:950] Out: 1550 [Urine:1550]  BLOOD ADMINISTERED:none  DRAINS: ROUTINE CHEST TUBES   LOCAL MEDICATIONS USED:  NONE  SPECIMEN:  No Specimen  DISPOSITION OF SPECIMEN:  N/A  COUNTS:  YES  TOURNIQUET:  * No tourniquets in log *  DICTATION: .Other Dictation: Dictation Number [id number]  PLAN OF CARE: Admit to inpatient   PATIENT DISPOSITION:  ICU - intubated and hemodynamically stable.   Delay start of Pharmacological VTE agent (>24hrs) due to surgical blood loss or risk of bleeding: yes  COMPLICATIONS: NO KNOWN

## 2016-09-24 NOTE — Anesthesia Procedure Notes (Signed)
Central Venous Catheter Insertion Performed by: Val Eagle, anesthesiologist Start/End4/17/2018 7:13 AM, 09/24/2016 7:26 AM Patient location: Pre-op. Preanesthetic checklist: patient identified, IV checked, site marked, risks and benefits discussed, surgical consent, monitors and equipment checked, pre-op evaluation, timeout performed and anesthesia consent Lidocaine 1% used for infiltration and patient sedated Hand hygiene performed  and maximum sterile barriers used  Catheter size: 8.5 Fr Total catheter length 10. Sheath introducer Procedure performed using ultrasound guided technique. Ultrasound Notes:anatomy identified, needle tip was noted to be adjacent to the nerve/plexus identified, no ultrasound evidence of intravascular and/or intraneural injection and image(s) printed for medical record Attempts: 1 Following insertion, line sutured and dressing applied. Post procedure assessment: blood return through all ports, free fluid flow and no air  Patient tolerated the procedure well with no immediate complications.

## 2016-09-24 NOTE — Anesthesia Procedure Notes (Signed)
Central Venous Catheter Insertion Performed by: Val Eagle, anesthesiologist Start/End4/17/2018 7:13 AM, 09/24/2016 7:26 AM Patient location: Pre-op. Preanesthetic checklist: patient identified, IV checked, site marked, risks and benefits discussed, surgical consent, monitors and equipment checked, pre-op evaluation, timeout performed and anesthesia consent Hand hygiene performed  and maximum sterile barriers used  PA cath was placed.Swan type:thermodilution Procedure performed without using ultrasound guided technique. Attempts: 1 Patient tolerated the procedure well with no immediate complications.

## 2016-09-24 NOTE — Anesthesia Preprocedure Evaluation (Addendum)
Anesthesia Evaluation  Patient identified by MRN, date of birth, ID band Patient awake    Reviewed: Allergy & Precautions, NPO status , Patient's Chart, lab work & pertinent test results  History of Anesthesia Complications Negative for: history of anesthetic complications  Airway Mallampati: III  TM Distance: >3 FB Neck ROM: Full    Dental  (+) Caps   Pulmonary asthma ,    breath sounds clear to auscultation       Cardiovascular hypertension, Pt. on medications and Pt. on home beta blockers + CAD, + Past MI and + Cardiac Stents   Rhythm:Regular     Neuro/Psych negative neurological ROS  negative psych ROS   GI/Hepatic Neg liver ROS, GERD  Controlled and Medicated,  Endo/Other    Renal/GU negative Renal ROS     Musculoskeletal  (+) Arthritis ,   Abdominal   Peds  Hematology  (+) anemia ,   Anesthesia Other Findings   Reproductive/Obstetrics                             Anesthesia Physical Anesthesia Plan  ASA: IV  Anesthesia Plan: General   Post-op Pain Management:    Induction: Intravenous  Airway Management Planned: Oral ETT  Additional Equipment: Arterial line, CVP, PA Cath, Ultrasound Guidance Line Placement and TEE  Intra-op Plan:   Post-operative Plan: Post-operative intubation/ventilation  Informed Consent: I have reviewed the patients History and Physical, chart, labs and discussed the procedure including the risks, benefits and alternatives for the proposed anesthesia with the patient or authorized representative who has indicated his/her understanding and acceptance.   Dental advisory given  Plan Discussed with: CRNA and Surgeon  Anesthesia Plan Comments:         Anesthesia Quick Evaluation

## 2016-09-24 NOTE — Progress Notes (Signed)
Patient ID: Gilbert Hutchinson, male   DOB: Oct 08, 1940, 76 y.o.   MRN: [id number]   SICU Evening Rounds:   Hemodynamically stable  CI = 2.2 on milrinone 0.25  Has started to wake up on vent. Weaning   Urine output good  CT output low  CBC    Component Value Date/Time   WBC 11.8 (H) 09/24/2016 1453   RBC 3.51 (L) 09/24/2016 1453   HGB 9.9 (L) 09/24/2016 1503   HCT 29.0 (L) 09/24/2016 1503   PLT 121 (L) 09/24/2016 1524   MCV 86.3 09/24/2016 1453   MCH 29.3 09/24/2016 1453   MCHC 34.0 09/24/2016 1453   RDW 13.3 09/24/2016 1453     BMET    Component Value Date/Time   NA 141 09/24/2016 1503   K 3.8 09/24/2016 1503   CL 103 09/24/2016 1317   CO2 23 09/20/2016 0850   GLUCOSE 113 (H) 09/24/2016 1503   BUN 15 09/24/2016 1317   CREATININE 0.80 09/24/2016 1317   CALCIUM 8.9 09/20/2016 0850   GFRNONAA >60 09/20/2016 0850   GFRAA >60 09/20/2016 0850     A/P:  Stable postop course. Continue current plans

## 2016-09-25 ENCOUNTER — Inpatient Hospital Stay (HOSPITAL_COMMUNITY): Payer: Medicare Other

## 2016-09-25 ENCOUNTER — Encounter (HOSPITAL_COMMUNITY): Payer: Self-pay | Admitting: Cardiothoracic Surgery

## 2016-09-25 LAB — BPAM RBC
Blood Product Expiration Date: 201805152359
Blood Product Expiration Date: 201805152359
ISSUE DATE / TIME: 201804170951
ISSUE DATE / TIME: 201804170951
Unit Type and Rh: 6200
Unit Type and Rh: 6200

## 2016-09-25 LAB — POCT I-STAT, CHEM 8
BUN: 14 mg/dL (ref 6–20)
Calcium, Ion: 1.2 mmol/L (ref 1.15–1.40)
Chloride: 99 mmol/L — ABNORMAL LOW (ref 101–111)
Creatinine, Ser: 0.8 mg/dL (ref 0.61–1.24)
Glucose, Bld: 139 mg/dL — ABNORMAL HIGH (ref 65–99)
HCT: 27 % — ABNORMAL LOW (ref 39.0–52.0)
Hemoglobin: 9.2 g/dL — ABNORMAL LOW (ref 13.0–17.0)
Potassium: 4.1 mmol/L (ref 3.5–5.1)
Sodium: 136 mmol/L (ref 135–145)
TCO2: 24 mmol/L (ref 0–100)

## 2016-09-25 LAB — PREPARE FRESH FROZEN PLASMA
Unit division: 0
Unit division: 0

## 2016-09-25 LAB — GLUCOSE, CAPILLARY
Glucose-Capillary: 114 mg/dL — ABNORMAL HIGH (ref 65–99)
Glucose-Capillary: 116 mg/dL — ABNORMAL HIGH (ref 65–99)
Glucose-Capillary: 116 mg/dL — ABNORMAL HIGH (ref 65–99)
Glucose-Capillary: 117 mg/dL — ABNORMAL HIGH (ref 65–99)
Glucose-Capillary: 118 mg/dL — ABNORMAL HIGH (ref 65–99)
Glucose-Capillary: 120 mg/dL — ABNORMAL HIGH (ref 65–99)
Glucose-Capillary: 123 mg/dL — ABNORMAL HIGH (ref 65–99)
Glucose-Capillary: 123 mg/dL — ABNORMAL HIGH (ref 65–99)
Glucose-Capillary: 130 mg/dL — ABNORMAL HIGH (ref 65–99)
Glucose-Capillary: 132 mg/dL — ABNORMAL HIGH (ref 65–99)
Glucose-Capillary: 133 mg/dL — ABNORMAL HIGH (ref 65–99)
Glucose-Capillary: 133 mg/dL — ABNORMAL HIGH (ref 65–99)
Glucose-Capillary: 139 mg/dL — ABNORMAL HIGH (ref 65–99)
Glucose-Capillary: 145 mg/dL — ABNORMAL HIGH (ref 65–99)
Glucose-Capillary: 154 mg/dL — ABNORMAL HIGH (ref 65–99)

## 2016-09-25 LAB — BPAM FFP
Blood Product Expiration Date: 201804222359
Blood Product Expiration Date: 201804222359
ISSUE DATE / TIME: 201804171209
ISSUE DATE / TIME: 201804171209
Unit Type and Rh: 6200
Unit Type and Rh: 6200

## 2016-09-25 LAB — TYPE AND SCREEN
ABO/RH(D): A POS
Antibody Screen: NEGATIVE
Unit division: 0
Unit division: 0

## 2016-09-25 LAB — CBC
HCT: 29.4 % — ABNORMAL LOW (ref 39.0–52.0)
HCT: 29 % — ABNORMAL LOW (ref 39.0–52.0)
HEMOGLOBIN: 9.8 g/dL — AB (ref 13.0–17.0)
Hemoglobin: 10 g/dL — ABNORMAL LOW (ref 13.0–17.0)
MCH: 29.2 pg (ref 26.0–34.0)
MCH: 29.5 pg (ref 26.0–34.0)
MCHC: 34 g/dL (ref 30.0–36.0)
MCHC: 33.8 g/dL (ref 30.0–36.0)
MCV: 86 fL (ref 78.0–100.0)
MCV: 87.3 fL (ref 78.0–100.0)
PLATELETS: 118 10*3/uL — AB (ref 150–400)
Platelets: 122 10*3/uL — ABNORMAL LOW (ref 150–400)
RBC: 3.42 MIL/uL — ABNORMAL LOW (ref 4.22–5.81)
RBC: 3.32 MIL/uL — AB (ref 4.22–5.81)
RDW: 13.6 % (ref 11.5–15.5)
RDW: 13.9 % (ref 11.5–15.5)
WBC: 10.9 10*3/uL — AB (ref 4.0–10.5)
WBC: 15.6 10*3/uL — AB (ref 4.0–10.5)

## 2016-09-25 LAB — BASIC METABOLIC PANEL
ANION GAP: 5 (ref 5–15)
BUN: 14 mg/dL (ref 6–20)
CALCIUM: 8.2 mg/dL — AB (ref 8.9–10.3)
CO2: 24 mmol/L (ref 22–32)
Chloride: 108 mmol/L (ref 101–111)
Creatinine, Ser: 0.88 mg/dL (ref 0.61–1.24)
GFR calc Af Amer: >60 mL/min (ref >60)
Glucose, Bld: 117 mg/dL — ABNORMAL HIGH (ref 65–99)
POTASSIUM: 3.9 mmol/L (ref 3.5–5.1)
SODIUM: 137 mmol/L (ref 135–145)

## 2016-09-25 LAB — PREPARE PLATELET PHERESIS: Unit division: 0

## 2016-09-25 LAB — BPAM PLATELET PHERESIS
Blood Product Expiration Date: 201804181307
ISSUE DATE / TIME: 201804171312
Unit Type and Rh: 6200

## 2016-09-25 LAB — MAGNESIUM
MAGNESIUM: 2.1 mg/dL (ref 1.7–2.4)
MAGNESIUM: 2 mg/dL (ref 1.7–2.4)

## 2016-09-25 LAB — CREATININE, SERUM: CREATININE: 0.98 mg/dL (ref 0.61–1.24)

## 2016-09-25 MED ORDER — ALPRAZOLAM 0.5 MG PO TABS: 0.5000 mg | ORAL_TABLET | Freq: Every day | ORAL | Status: DC

## 2016-09-25 MED ORDER — FENTANYL CITRATE (PF) 100 MCG/2ML IJ SOLN
25.0000 ug | INTRAMUSCULAR | Status: DC | PRN
  Administered 2016-09-25 – 2016-09-26 (×6): 25 ug via INTRAVENOUS
  Filled 2016-09-25 (×5): qty 2

## 2016-09-25 MED ORDER — ORAL CARE MOUTH RINSE
15.0000 mL | Freq: Two times a day (BID) | OROMUCOSAL | Status: DC
  Administered 2016-09-26: 15 mL via OROMUCOSAL

## 2016-09-25 MED ORDER — MILRINONE LACTATE IN DEXTROSE 20-5 MG/100ML-% IV SOLN
0.1250 ug/kg/min | INTRAVENOUS | Status: DC
  Administered 2016-09-25: 0.125 ug/kg/min via INTRAVENOUS
  Filled 2016-09-25: qty 100

## 2016-09-25 MED ORDER — FAMOTIDINE IN NACL 20-0.9 MG/50ML-% IV SOLN
20.0000 mg | Freq: Two times a day (BID) | INTRAVENOUS | Status: AC
  Administered 2016-09-25 – 2016-09-28 (×7): 20 mg via INTRAVENOUS
  Filled 2016-09-25 (×7): qty 50

## 2016-09-25 MED ORDER — FUROSEMIDE 10 MG/ML IJ SOLN
20.0000 mg | Freq: Two times a day (BID) | INTRAMUSCULAR | Status: DC
  Administered 2016-09-25 (×2): 20 mg via INTRAVENOUS
  Filled 2016-09-25 (×2): qty 2

## 2016-09-25 MED ORDER — KETOROLAC TROMETHAMINE 15 MG/ML IJ SOLN
15.0000 mg | Freq: Four times a day (QID) | INTRAMUSCULAR | Status: AC
  Administered 2016-09-25 (×3): 15 mg via INTRAVENOUS
  Filled 2016-09-25 (×3): qty 1

## 2016-09-25 MED ORDER — INSULIN ASPART 100 UNIT/ML ~~LOC~~ SOLN
0.0000 [IU] | SUBCUTANEOUS | Status: DC
  Administered 2016-09-25 – 2016-09-26 (×7): 2 [IU] via SUBCUTANEOUS

## 2016-09-25 MED ORDER — METOPROLOL TARTRATE 25 MG PO TABS
25.0000 mg | ORAL_TABLET | Freq: Two times a day (BID) | ORAL | Status: DC
  Administered 2016-09-25 – 2016-10-01 (×12): 25 mg via ORAL
  Filled 2016-09-25 (×12): qty 1

## 2016-09-25 MED ORDER — HYDRALAZINE HCL 20 MG/ML IJ SOLN
10.0000 mg | Freq: Four times a day (QID) | INTRAMUSCULAR | Status: DC | PRN
  Administered 2016-09-25 – 2016-09-26 (×2): 10 mg via INTRAVENOUS
  Filled 2016-09-25 (×3): qty 1

## 2016-09-25 MED ORDER — METOPROLOL TARTRATE 12.5 MG HALF TABLET
12.5000 mg | ORAL_TABLET | Freq: Once | ORAL | Status: AC
  Administered 2016-09-25: 12.5 mg via ORAL
  Filled 2016-09-25: qty 1

## 2016-09-25 MED ORDER — SIMETHICONE 80 MG PO CHEW
80.0000 mg | CHEWABLE_TABLET | Freq: Four times a day (QID) | ORAL | Status: DC
  Administered 2016-09-25 – 2016-10-01 (×20): 80 mg via ORAL
  Filled 2016-09-25 (×20): qty 1

## 2016-09-25 MED ORDER — ASPIRIN EC 81 MG PO TBEC
81.0000 mg | DELAYED_RELEASE_TABLET | Freq: Every day | ORAL | Status: DC
  Administered 2016-09-25 – 2016-10-01 (×7): 81 mg via ORAL
  Filled 2016-09-25 (×7): qty 1

## 2016-09-25 MED ORDER — ALPRAZOLAM 0.5 MG PO TABS
0.5000 mg | ORAL_TABLET | Freq: Two times a day (BID) | ORAL | Status: DC
  Administered 2016-09-25 (×2): 0.5 mg via ORAL
  Filled 2016-09-25 (×2): qty 1

## 2016-09-25 MED ORDER — ASPIRIN 81 MG PO CHEW: 324.0000 mg | CHEWABLE_TABLET | Freq: Every day | ORAL | Status: DC

## 2016-09-25 MED ORDER — SODIUM CHLORIDE 0.9 % IV SOLN
30.0000 meq | Freq: Once | INTRAVENOUS | Status: AC
  Administered 2016-09-25: 30 meq via INTRAVENOUS
  Filled 2016-09-25: qty 15

## 2016-09-25 MED ORDER — INSULIN DETEMIR 100 UNIT/ML ~~LOC~~ SOLN
10.0000 [IU] | Freq: Two times a day (BID) | SUBCUTANEOUS | Status: DC
  Administered 2016-09-25 – 2016-09-26 (×4): 10 [IU] via SUBCUTANEOUS
  Filled 2016-09-25 (×5): qty 0.1

## 2016-09-25 NOTE — Progress Notes (Signed)
      [address]       [place] [address]             [phone]      PM rounds  Resting comfortably  BP (!) 149/85   Pulse 90   Temp 98.5 F (36.9 C) (Oral)   Resp 18   Wt 236 lb 15.9 oz (107.5 kg)   SpO2 95%   BMI 28.10 kg/m    Intake/Output Summary (Last 24 hours) at 09/25/16 1703 Last data filed at 09/25/16 1300  Gross per 24 hour  Intake          3919.46 ml  Output             2035 ml  Net          1884.46 ml   PM labs pending  Doing well POD # 1  Jeff Frieden C. Dorris Fetch, MD [facility] [phone]

## 2016-09-25 NOTE — Progress Notes (Signed)
1 Day Post-Op Procedure(s) (LRB): CORONARY ARTERY BYPASS GRAFTING (CABG) x 4 using the left internal mammary artery and the left greater saphenous vein harvested endoscopically. (N/A) TRANSESOPHAGEAL ECHOCARDIOGRAM (TEE) (N/A) Subjective: Extubated with low lung volumes, complaining of incisional soreness and inability to take a deep breath Chest x-ray shows low lung volumes with gastric dilatation Normal sinus rhythm without EKG changes Hemodynamics stable overnight off pressor worse Plan mobilization out of bed today, diuresis and slow wean of milrinone Hypertension managed with IV nitroglycerin with transition to oral preop meds Objective: Vital signs in last 24 hours: Temp:  [97.3 F (36.3 C)-100.2 F (37.9 C)] 98.5 F (36.9 C) (04/18 1500) Pulse Rate:  [70-95] 82 (04/18 1700) Cardiac Rhythm: Normal sinus rhythm (04/18 1600) Resp:  [6-36] 17 (04/18 1700) BP: (87-149)/(58-86) 133/75 (04/18 1700) SpO2:  [93 %-100 %] 94 % (04/18 1700) Arterial Line BP: (107-201)/(44-190) 163/58 (04/18 0900) FiO2 (%):  [40 %-50 %] 40 % (04/17 2059) Weight:  [236 lb 15.9 oz (107.5 kg)] 236 lb 15.9 oz (107.5 kg) (04/18 0500)  Hemodynamic parameters for last 24 hours: PAP: (18-42)/(2-18) 29/13 CO:  [5.3 L/min-10 L/min] 10 L/min CI:  [2.2 L/min/m2-4.4 L/min/m2] 4.4 L/min/m2  Intake/Output from previous day: 04/17 0701 - 04/18 0700 In: 7611.7 [I.V.:4003.7; ZOXWR:6045; NG/GT:30; IV Piggyback:1615] Out: 5480 [Urine:3665; Blood:1150; Chest Tube:665] Intake/Output this shift: Total I/O In: 1538.7 [P.O.:490; I.V.:618.7; IV Piggyback:430] Out: 705 [Urine:625; Chest Tube:80]       Exam    General- alert and comfortable   Lungs- clear without rales, wheezes   Cor- regular rate and rhythm, no murmur , gallop   Abdomen- soft, non-tender   Extremities - warm, non-tender, minimal edema   Neuro- oriented, appropriate, no focal weakness   Lab Results:  Recent Labs  09/25/16 0405 09/25/16 1650   WBC 10.9* 15.6*  HGB 10.0* 9.8*  HCT 29.4* 29.0*  PLT 118* 122*   BMET:  Recent Labs  09/24/16 2106 09/25/16 0405 09/25/16 1650  NA 139 137  --   K 3.9 3.9  --   CL 105 108  --   CO2  --  24  --   GLUCOSE 145* 117*  --   BUN 15 14  --   CREATININE 0.80 0.88 0.98  CALCIUM  --  8.2*  --     PT/INR:  Recent Labs  09/24/16 1524  LABPROT 16.7*  INR 1.34   ABG    Component Value Date/Time   PHART 7.354 09/24/2016 2345   HCO3 23.8 09/24/2016 2345   TCO2 25 09/24/2016 2345   ACIDBASEDEF 2.0 09/24/2016 2345   O2SAT 94.0 09/24/2016 2345   CBG (last 3)   Recent Labs  09/25/16 1140 09/25/16 1548 09/25/16 1652  GLUCAP 154* 145* 118*    Assessment/Plan: S/P Procedure(s) (LRB): CORONARY ARTERY BYPASS GRAFTING (CABG) x 4 using the left internal mammary artery and the left greater saphenous vein harvested endoscopically. (N/A) TRANSESOPHAGEAL ECHOCARDIOGRAM (TEE) (N/A) Mobilize Diuresis Diabetes control See progression orders Twice a day IV Lasix for volume overload   LOS: 1 day    Gilbert Hutchinson 09/25/2016

## 2016-09-25 NOTE — Op Note (Signed)
Gilbert Hutchinson, Gilbert Hutchinson NO.:  [id number]  MEDICAL RECORD NO.:  [id number]  LOCATION:                               FACILITY:  [facility]  PHYSICIAN:  Kerin Perna, M.D.  DATE OF BIRTH:  1941/06/08  DATE OF PROCEDURE:  09/24/2016 DATE OF DISCHARGE:                              OPERATIVE REPORT   OPERATION: 1. Coronary artery bypass grafting x4 (left internal mammary artery to     LAD, saphenous vein graft to diagonal, saphenous vein graft to     ramus intermediate, saphenous vein graft to circumflex marginal). 2. Endoscopic harvest of left leg greater saphenous vein with     exposure, but not harvest of right leg greater saphenous vein.  SURGEON:  Kerin Perna, M.D.  ASSISTANT:  Wayne E. gold, PA-C.  ANESTHESIA:  General by Dr. Kipp Brood.  CLINICAL NOTE:  The patient is a 76 year old Caucasian male with hypertension and mild obesity, who suffered a PMI in September 2017, requiring emergency PCI with drug-eluting stents to the right coronary at [facility].  He required ICU care at that time.  He had other coronary disease, which was not felt to be critical.  He recovered from his MI and PCIs with ejection fraction of approximately 45%.  More recently, the patient developed symptoms of recurrent angina and a stress test was performed.  This showed evidence of new ischemic changes in the anterior and lateral LV.  He subsequently underwent repeat cardiac catheterization, which showed a widely patent PCI of the RCA, but with high-grade stenosis of the LAD and circumflex circulation. Surgical coronary revascularization was recommended because of his severe multivessel coronary artery disease and reduced LVEF and progression of symptoms.  I examined the patient in the office and reviewed his studies from [facility].  I agreed with his cardiologist's recommendation for multivessel CABG as his best long- term therapy for his  coronary artery disease.  I discussed the details of surgery extensively with the patient and his family including the use of general anesthesia and cardiopulmonary bypass, the location of the surgical incisions, the expected postoperative hospital recovery, and the potential risks of the procedure including risks of stroke, bleeding, blood transfusion requirement, postoperative pulmonary problems including pleural effusion, postoperative arrhythmias, postoperative infection, and death.  After reviewing these issues, he demonstrated his understanding and agreed to proceed with surgery and what I felt was an informed consent.  OPERATIVE FINDINGS: 1. Patent RCA stents, no grafts needed. 2. Diffuse severe coronary disease of the LAD and circumflex     distributions with small 1 mm vessels in the diagonal and     circumflex marginal territory. 3. Deeply intramyocardial LAD. 4. Good conduit from the saphenous vein and the internal mammary     artery. 5. Postpump coagulopathy most likely related to his preoperative use     of Effient anticoagulant therapy for his stent.  OPERATIVE PROCEDURE:  The patient was brought to the operating room, placed supine on the operating table.  General anesthesia was induced under invasive hemodynamic monitoring.  The chest, abdomen, and legs were prepped with Betadine and draped as a  sterile field.  A proper time- out was performed.  A sternal incision was made as the saphenous vein was exposed in the right leg.  It was not harvested because of its small size and multiple branches.  A second incision was made in the left leg and the saphenous vein was of good quality and size and was harvested endoscopically.  The left internal mammary artery was harvested as a pedicle graft from its origin at the subclavian vessels.  It was 1.5-mm vessel with excellent flow.  A sternal retractor was placed using the deep blades due to the patient's obese body habitus.   The pericardium was opened and suspended. The ascending aorta was inspected, palpated, and examined.  He had some mild thickening, but no heavy calcification.  Pursestrings were placed in ascending aorta and right atrium.  Heparin was administered and when the ACT was documented as being therapeutic, the patient was cannulated and placed on cardiopulmonary bypass.  A coronary stent applied for grafting.  The LAD was intramyocardial and a full dissection was not completed until cardioplegic arrest was established.  The diagonal was small but graftable, the circumflex was small but graftable, the ramus intermediate was a larger vessel, partially intramyocardial, which was graftable.  Cardioplegic cannulas were placed both antegrade and retrograde cold blood cardioplegia and the patient was cooled to 32 degrees.  The aortic crossclamp was applied and a liter of cold blood cardioplegia was delivered in split doses between the antegrade aortic and retrograde coronary sinus catheters.  There was good cardioplegic arrest and septal temperature dropped less than 12 degrees.  Cardioplegia was delivered every 20 minutes or less.  The distal coronary anastomoses were performed.  The first distal anastomosis was the circumflex marginal.  This was chronically occluded. It was a 1 mm vessel.  A reverse saphenous vein was sewn end-to-side with running 7-0 Prolene.  There was adequate flow through the graft. Cardioplegia was redosed.  The second distal anastomosis was the ramus intermediate branch of the left coronary.  This had a proximal 80% to 90% stenosis.  It was 1.5 mm. A reverse saphenous vein was sewn end-to-side with running 7-0 Prolene. There was excellent flow through the graft.  Cardioplegia was redosed.  The third distal anastomosis was to the diagonal branch to the LAD.  It was heavily calcified proximally and had a 90% stenosis.  It was 1 mm in diameter.  A reverse saphenous vein was  sewn end-to-side with a running 7-0 Prolene with adequate flow through the graft.  Cardioplegia was redosed.  The fourth and final distal anastomosis were to the distal LAD.  It was deeply intramyocardial.  It was carefully dissected out.  There were several coronary veins in the territory and the bleeding was controlled with some transfixion sutures and cautery.  The LAD was 1.5 mm in diameter.  A 1.5 mm probe passed proximally up to the point of 95% stenosis.  The left IMA pedicle was brought through an opening in the left lateral pericardium and it was brought down onto the LAD and sewn end-to-side with running 8-0 Prolene.  The bulldog on the pedicle was temporarily released and there was excellent flow through the anastomosis, which was hemostatic.  The bulldog was reapplied and the pedicle was secured to the epicardium.  Cardioplegia was redosed.  The crossclamp was still in place, 3 proximal vein anastomoses were performed using a 4.5-mm punch and running 6-0 Prolene.  The aortic wall was thickened, but adequate proximal  coronary anastomoses were established with excellent flow noted after removal of the clamp.  Prior to removal of the clamp, air was vented from the coronaries on the left side of the heart using the usual de-airing maneuvers as well as a dose of retrograde warm blood cardioplegia.  The crossclamp was removed.  The heart resumed a spontaneous rhythm.  The vein grafts were de-aired and opened.  Each had good flow.  Hemostasis was achieved in the proximal and distal anastomoses.  The patient was rewarmed and reperfused.  Temporary pacing wires were applied.  The lungs were expanded and the ventilator was resumed.  The patient was weaned from cardiopulmonary bypass on low-dose milrinone without difficulty.  Echo showed preserved global LV function by TEE, which was placed by the anesthesiologist at the beginning of the operation.  The patient was administered  protamine.  After reversal of heparin with protamine, there were still diffuse bleeding and evidence of coagulopathy.  The patient was then given FFP, platelets, and coagulation function improved.  The superior pericardial fat was closed over the aorta.  Anterior mediastinal and left pleural chest tubes were placed and brought out through separate incisions.  The sternum was closed with wire.  The patient remained hemodynamically stable.  Thus, the pectoral fascia was closed with running #1 Vicryl.  The subcutaneous and skin layers were closed in running Vicryl and sterile dressings were applied.  Total cardiopulmonary bypass time was 153 minutes.     Kerin Perna, M.D.   ______________________________ Kerin Perna, M.D.    PV/MEDQ  D:  09/25/2016  T:  09/25/2016  Job:  [id number]  cc:   Eulah Citizen, MD

## 2016-09-25 NOTE — Op Note (Deleted)
  The note originally documented on this encounter has been moved the the encounter in which it belongs.  

## 2016-09-25 NOTE — Plan of Care (Signed)
Problem: Activity: Goal: Risk for activity intolerance will decrease Outcome: Progressing Pt to be up OOB in AM  Problem: Bowel/Gastric: Goal: Gastrointestinal status for postoperative course will improve Outcome: Not Progressing Hypoactive BS  Problem: Cardiac: Goal: Hemodynamic stability will improve Outcome: Progressing Within parameters Goal: Ability to maintain an adequate cardiac output will improve Outcome: Progressing Within parameters Goal: Will show no signs and symptoms of excessive bleeding Outcome: Progressing Within parameters

## 2016-09-26 ENCOUNTER — Inpatient Hospital Stay (HOSPITAL_COMMUNITY): Payer: Medicare Other

## 2016-09-26 LAB — POCT I-STAT, CHEM 8
BUN: 13 mg/dL (ref 6–20)
Calcium, Ion: 1.16 mmol/L (ref 1.15–1.40)
Chloride: 96 mmol/L — ABNORMAL LOW (ref 101–111)
Creatinine, Ser: 0.8 mg/dL (ref 0.61–1.24)
Glucose, Bld: 138 mg/dL — ABNORMAL HIGH (ref 65–99)
HCT: 29 % — ABNORMAL LOW (ref 39.0–52.0)
Hemoglobin: 9.9 g/dL — ABNORMAL LOW (ref 13.0–17.0)
Potassium: 3.8 mmol/L (ref 3.5–5.1)
Sodium: 131 mmol/L — ABNORMAL LOW (ref 135–145)
TCO2: 27 mmol/L (ref 0–100)

## 2016-09-26 LAB — CBC
HCT: 28.3 % — ABNORMAL LOW (ref 39.0–52.0)
Hemoglobin: 9.6 g/dL — ABNORMAL LOW (ref 13.0–17.0)
MCH: 29.4 pg (ref 26.0–34.0)
MCHC: 33.9 g/dL (ref 30.0–36.0)
MCV: 86.8 fL (ref 78.0–100.0)
Platelets: 110 10*3/uL — ABNORMAL LOW (ref 150–400)
RBC: 3.26 MIL/uL — ABNORMAL LOW (ref 4.22–5.81)
RDW: 13.9 % (ref 11.5–15.5)
WBC: 13.2 10*3/uL — ABNORMAL HIGH (ref 4.0–10.5)

## 2016-09-26 LAB — GLUCOSE, CAPILLARY
Glucose-Capillary: 92 mg/dL (ref 65–99)
Glucose-Capillary: 124 mg/dL — ABNORMAL HIGH (ref 65–99)
Glucose-Capillary: 137 mg/dL — ABNORMAL HIGH (ref 65–99)
Glucose-Capillary: 131 mg/dL — ABNORMAL HIGH (ref 65–99)
Glucose-Capillary: 132 mg/dL — ABNORMAL HIGH (ref 65–99)

## 2016-09-26 LAB — BASIC METABOLIC PANEL
Anion gap: 6 (ref 5–15)
BUN: 12 mg/dL (ref 6–20)
CO2: 25 mmol/L (ref 22–32)
Calcium: 7.8 mg/dL — ABNORMAL LOW (ref 8.9–10.3)
Chloride: 99 mmol/L — ABNORMAL LOW (ref 101–111)
Creatinine, Ser: 0.83 mg/dL (ref 0.61–1.24)
GFR calc Af Amer: >60 mL/min (ref >60)
GFR calc non Af Amer: >60 mL/min (ref >60)
Glucose, Bld: 127 mg/dL — ABNORMAL HIGH (ref 65–99)
Potassium: 3.9 mmol/L (ref 3.5–5.1)
Sodium: 130 mmol/L — ABNORMAL LOW (ref 135–145)

## 2016-09-26 MED ORDER — FENTANYL CITRATE (PF) 100 MCG/2ML IJ SOLN: 25.0000 ug | INTRAMUSCULAR | Status: DC | PRN

## 2016-09-26 MED ORDER — SODIUM CHLORIDE 0.9% FLUSH
10.0000 mL | Freq: Two times a day (BID) | INTRAVENOUS | Status: DC
  Administered 2016-09-26 – 2016-09-30 (×6): 10 mL

## 2016-09-26 MED ORDER — POTASSIUM CHLORIDE CRYS ER 20 MEQ PO TBCR
20.0000 meq | EXTENDED_RELEASE_TABLET | Freq: Two times a day (BID) | ORAL | Status: DC
  Administered 2016-09-26 – 2016-10-01 (×11): 20 meq via ORAL
  Filled 2016-09-26 (×11): qty 1

## 2016-09-26 MED ORDER — CHLORHEXIDINE GLUCONATE CLOTH 2 % EX PADS
6.0000 | MEDICATED_PAD | Freq: Every day | CUTANEOUS | Status: DC
  Administered 2016-09-26 – 2016-09-27 (×2): 6 via TOPICAL

## 2016-09-26 MED ORDER — ALPRAZOLAM 0.5 MG PO TABS
0.5000 mg | ORAL_TABLET | Freq: Three times a day (TID) | ORAL | Status: DC
  Administered 2016-09-26 – 2016-10-01 (×16): 0.5 mg via ORAL
  Filled 2016-09-26 (×16): qty 1

## 2016-09-26 MED ORDER — SODIUM CHLORIDE 0.9 % IV SOLN
30.0000 meq | Freq: Once | INTRAVENOUS | Status: AC
  Administered 2016-09-26: 30 meq via INTRAVENOUS
  Filled 2016-09-26: qty 15

## 2016-09-26 MED ORDER — SODIUM CHLORIDE 0.9% FLUSH: 10.0000 mL | INTRAVENOUS | Status: DC | PRN

## 2016-09-26 MED ORDER — FUROSEMIDE 10 MG/ML IJ SOLN
40.0000 mg | Freq: Two times a day (BID) | INTRAMUSCULAR | Status: DC
  Administered 2016-09-26 – 2016-09-27 (×3): 40 mg via INTRAVENOUS
  Filled 2016-09-26 (×3): qty 4

## 2016-09-26 MED ORDER — FE FUMARATE-B12-VIT C-FA-IFC PO CAPS
1.0000 | ORAL_CAPSULE | Freq: Two times a day (BID) | ORAL | Status: DC
  Administered 2016-09-26 – 2016-10-01 (×11): 1 via ORAL
  Filled 2016-09-26 (×11): qty 1

## 2016-09-26 MED FILL — Lidocaine HCl IV Inj 20 MG/ML: INTRAVENOUS | Qty: 5 | Status: AC

## 2016-09-26 MED FILL — Sodium Bicarbonate IV Soln 8.4%: INTRAVENOUS | Qty: 50 | Status: AC

## 2016-09-26 MED FILL — Electrolyte-R (PH 7.4) Solution: INTRAVENOUS | Qty: 5000 | Status: AC

## 2016-09-26 MED FILL — Sodium Chloride IV Soln 0.9%: INTRAVENOUS | Qty: 3000 | Status: AC

## 2016-09-26 MED FILL — Heparin Sodium (Porcine) Inj 1000 Unit/ML: INTRAMUSCULAR | Qty: 10 | Status: AC

## 2016-09-26 MED FILL — Mannitol IV Soln 20%: INTRAVENOUS | Qty: 500 | Status: AC

## 2016-09-26 NOTE — Progress Notes (Signed)
Patient ID: Gilbert Hutchinson, male   DOB: [birth year], 76 y.o.   MRN: [id number] SICU Evening Rounds:  Hemodynamically stable in sinus rhythm  Milrinone weaned off today  Good urine output.  BMET    Component Value Date/Time   NA 131 (L) 09/26/2016 1525   K 3.8 09/26/2016 1525   CL 96 (L) 09/26/2016 1525   CO2 25 09/26/2016 0307   GLUCOSE 138 (H) 09/26/2016 1525   BUN 13 09/26/2016 1525   CREATININE 0.80 09/26/2016 1525   CALCIUM 7.8 (L) 09/26/2016 0307   GFRNONAA >60 09/26/2016 0307   GFRAA >60 09/26/2016 0307    CBC    Component Value Date/Time   WBC 13.2 (H) 09/26/2016 0307   RBC 3.26 (L) 09/26/2016 0307   HGB 9.9 (L) 09/26/2016 1525   HCT 29.0 (L) 09/26/2016 1525   PLT 110 (L) 09/26/2016 0307   MCV 86.8 09/26/2016 0307   MCH 29.4 09/26/2016 0307   MCHC 33.9 09/26/2016 0307   RDW 13.9 09/26/2016 0307   A/P: stable day. Continue current plans.

## 2016-09-27 ENCOUNTER — Inpatient Hospital Stay (HOSPITAL_COMMUNITY): Payer: Medicare Other

## 2016-09-27 LAB — GLUCOSE, CAPILLARY
Glucose-Capillary: 102 mg/dL — ABNORMAL HIGH (ref 65–99)
Glucose-Capillary: 106 mg/dL — ABNORMAL HIGH (ref 65–99)
Glucose-Capillary: 112 mg/dL — ABNORMAL HIGH (ref 65–99)
Glucose-Capillary: 113 mg/dL — ABNORMAL HIGH (ref 65–99)
Glucose-Capillary: 121 mg/dL — ABNORMAL HIGH (ref 65–99)
Glucose-Capillary: 124 mg/dL — ABNORMAL HIGH (ref 65–99)
Glucose-Capillary: 92 mg/dL (ref 65–99)

## 2016-09-27 LAB — BASIC METABOLIC PANEL
Anion gap: 9 (ref 5–15)
BUN: 13 mg/dL (ref 6–20)
CO2: 27 mmol/L (ref 22–32)
Calcium: 8.3 mg/dL — ABNORMAL LOW (ref 8.9–10.3)
Chloride: 95 mmol/L — ABNORMAL LOW (ref 101–111)
Creatinine, Ser: 0.92 mg/dL (ref 0.61–1.24)
GFR calc Af Amer: >60 mL/min (ref >60)
GFR calc non Af Amer: >60 mL/min (ref >60)
Glucose, Bld: 101 mg/dL — ABNORMAL HIGH (ref 65–99)
Potassium: 3.6 mmol/L (ref 3.5–5.1)
Sodium: 131 mmol/L — ABNORMAL LOW (ref 135–145)

## 2016-09-27 LAB — CBC
HCT: 29.1 % — ABNORMAL LOW (ref 39.0–52.0)
Hemoglobin: 9.9 g/dL — ABNORMAL LOW (ref 13.0–17.0)
MCH: 29.7 pg (ref 26.0–34.0)
MCHC: 34 g/dL (ref 30.0–36.0)
MCV: 87.4 fL (ref 78.0–100.0)
Platelets: 118 10*3/uL — ABNORMAL LOW (ref 150–400)
RBC: 3.33 MIL/uL — ABNORMAL LOW (ref 4.22–5.81)
RDW: 13.8 % (ref 11.5–15.5)
WBC: 12.1 10*3/uL — ABNORMAL HIGH (ref 4.0–10.5)

## 2016-09-27 MED ORDER — SODIUM CHLORIDE 0.9 % IV SOLN: 250.0000 mL | INTRAVENOUS | Status: DC | PRN

## 2016-09-27 MED ORDER — AMIODARONE HCL 200 MG PO TABS
400.0000 mg | ORAL_TABLET | Freq: Two times a day (BID) | ORAL | Status: AC
  Administered 2016-09-27: 400 mg via ORAL
  Filled 2016-09-27: qty 2

## 2016-09-27 MED ORDER — SORBITOL 70 % SOLN
30.0000 mL | Freq: Every morning | Status: DC
  Administered 2016-09-27: 30 mL via ORAL
  Filled 2016-09-27 (×2): qty 30

## 2016-09-27 MED ORDER — AMIODARONE HCL IN DEXTROSE 360-4.14 MG/200ML-% IV SOLN
60.0000 mg/h | INTRAVENOUS | Status: AC
  Administered 2016-09-27: 60 mg/h via INTRAVENOUS
  Filled 2016-09-27 (×2): qty 200

## 2016-09-27 MED ORDER — AMIODARONE HCL 200 MG PO TABS: 400.0000 mg | ORAL_TABLET | Freq: Two times a day (BID) | ORAL | Status: DC

## 2016-09-27 MED ORDER — INSULIN DETEMIR 100 UNIT/ML ~~LOC~~ SOLN
10.0000 [IU] | Freq: Every day | SUBCUTANEOUS | Status: DC
  Administered 2016-09-27: 10 [IU] via SUBCUTANEOUS
  Filled 2016-09-27: qty 0.1

## 2016-09-27 MED ORDER — SODIUM CHLORIDE 0.9% FLUSH: 3.0000 mL | Freq: Two times a day (BID) | INTRAVENOUS | Status: DC

## 2016-09-27 MED ORDER — MOVING RIGHT ALONG BOOK
Freq: Once | Status: AC
Start: 2016-09-27 — End: 2016-09-27
  Administered 2016-09-27: 09:00:00
  Filled 2016-09-27: qty 1

## 2016-09-27 MED ORDER — AMIODARONE HCL IN DEXTROSE 360-4.14 MG/200ML-% IV SOLN
30.0000 mg/h | INTRAVENOUS | Status: DC
  Administered 2016-09-27 – 2016-09-28 (×2): 30 mg/h via INTRAVENOUS
  Filled 2016-09-27: qty 200

## 2016-09-27 MED ORDER — HYDROCODONE-ACETAMINOPHEN 5-325 MG PO TABS
1.0000 | ORAL_TABLET | ORAL | Status: DC | PRN
  Administered 2016-09-27 – 2016-10-01 (×9): 1 via ORAL
  Filled 2016-09-27 (×9): qty 1

## 2016-09-27 MED ORDER — SODIUM CHLORIDE 0.9% FLUSH: 3.0000 mL | INTRAVENOUS | Status: DC | PRN

## 2016-09-27 MED ORDER — SODIUM CHLORIDE 0.9 % IV SOLN
30.0000 meq | Freq: Once | INTRAVENOUS | Status: AC
  Administered 2016-09-27: 30 meq via INTRAVENOUS
  Filled 2016-09-27: qty 15

## 2016-09-27 MED ORDER — INSULIN ASPART 100 UNIT/ML ~~LOC~~ SOLN: 0.0000 [IU] | Freq: Three times a day (TID) | SUBCUTANEOUS | Status: DC

## 2016-09-27 MED ORDER — FUROSEMIDE 10 MG/ML IJ SOLN
40.0000 mg | Freq: Every day | INTRAMUSCULAR | Status: DC
  Administered 2016-09-28: 40 mg via INTRAVENOUS
  Filled 2016-09-27: qty 4

## 2016-09-27 MED ORDER — CLOPIDOGREL BISULFATE 75 MG PO TABS
75.0000 mg | ORAL_TABLET | Freq: Every day | ORAL | Status: DC
  Administered 2016-09-28 – 2016-10-01 (×4): 75 mg via ORAL
  Filled 2016-09-27 (×4): qty 1

## 2016-09-27 NOTE — Care Management Note (Signed)
Case Management Note  Patient Details  Name: RAM HAUGAN MRN: [id number] Date of Birth: September 09, 1940  Subjective/Objective:    Pt is s/p CABG                  Action/Plan:  PTA completely independent from home with wife however wife has dementia and a care giver.  Pt has a very supportive family - and will make sure the pt has 24 hour supervision at discharge.  Family is interested in private pay list - CM provided list as requested.  Pt continues to have CT and EPC wires.  CM will continue to follow for discharge needs   Expected Discharge Date:                  Expected Discharge Plan:  Home w Home Health Services  In-House Referral:     Discharge planning Services  CM Consult  Post Acute Care Choice:    Choice offered to:     DME Arranged:    DME Agency:     HH Arranged:    HH Agency:     Status of Service:     If discussed at [employer], dates discussed:    Additional Comments:  Cherylann Parr, RN 09/27/2016, 4:28 PM

## 2016-09-27 NOTE — Discharge Instructions (Signed)
Coronary Artery Bypass Grafting, Care After °This sheet gives you information about how to care for yourself after your procedure. Your health care provider may also give you more specific instructions. If you have problems or questions, contact your health care provider. °What can I expect after the procedure? °After the procedure, it is common to have: °· Nausea and a lack of appetite. °· Constipation. °· Weakness and fatigue. °· Depression or irritability. °· Pain or discomfort in your incision areas. °Follow these instructions at home: °Medicines  °· Take over-the-counter and prescription medicines only as told by your health care provider. Do not stop taking medicines or start any new medicines without approval from your health care provider. °· If you were prescribed an antibiotic medicine, take it as told by your health care provider. Do not stop taking the antibiotic even if you start to feel better. °· Do not drive or use heavy machinery while taking prescription pain medicine. °Incision care  °· Follow instructions from your health care provider about how to take care of your incisions. Make sure you: °¨ Wash your hands with soap and water before you change your bandage (dressing). If soap and water are not available, use hand sanitizer. °¨ Change your dressing as told by your health care provider. °¨ Leave stitches (sutures), skin glue, or adhesive strips in place. These skin closures may need to stay in place for 2 weeks or longer. If adhesive strip edges start to loosen and curl up, you may trim the loose edges. Do not remove adhesive strips completely unless your health care provider tells you to do that. °· Keep incision areas clean, dry, and protected. °· Check your incision areas every day for signs of infection. Check for: °¨ More redness, swelling, or pain. °¨ More fluid or blood. °¨ Warmth. °¨ Pus or a bad smell. °· If incisions were made in your legs: °¨ Avoid crossing your legs. °¨ Avoid  sitting for long periods of time. Change positions every 30 minutes. °¨ Raise (elevate) your legs when you are sitting. °Bathing  °· Do not take baths, swim, or use a hot tub until your health care provider approves. °· Only take sponge baths. Pat the incisions dry. Do not rub incisions with a washcloth or towel. °· Ask your health care provider when you can shower. °Eating and drinking  °· Eat foods that are high in fiber, such as raw fruits and vegetables, whole grains, beans, and nuts. Meats should be lean cut. Avoid canned, processed, and fried foods. This can help prevent constipation and is a recommended part of a heart-healthy diet. °· Drink enough fluid to keep your urine clear or pale yellow. °· Limit alcohol intake to no more than 1 drink a day for nonpregnant women and 2 drinks a day for men. One drink equals 12 oz of beer, 5 oz of wine, or 1½ oz of hard liquor. °Activity  °· Rest and limit your activity as told by your health care provider. You may be instructed to: °¨ Stop any activity right away if you have chest pain, shortness of breath, irregular heartbeats, or dizziness. Get help right away if you have any of these symptoms. °¨ Move around frequently for short periods or take short walks as directed by your health care provider. Gradually increase your activities. You may need physical therapy or cardiac rehabilitation to help strengthen your muscles and build your endurance. °¨ Avoid lifting, pushing, or pulling anything that is heavier than 10 lb (  4.5 kg) for at least 6 weeks or as told by your health care provider. °· Do not drive until your health care provider approves. °· Ask your health care provider when you may return to work. °· Ask your health care provider when you may resume sexual activity. °General instructions  °· Do not use any products that contain nicotine or tobacco, such as cigarettes and e-cigarettes. If you need help quitting, ask your health care provider. °· Take 2-3 deep  breaths every few hours during the day, while you recover. This helps expand your lungs and prevent complications like pneumonia after surgery. °· If you were given a device called an incentive spirometer, use it several times a day to practice deep breathing. Support your chest with a pillow or your arms when you take deep breaths or cough. °· Wear compression stockings as told by your health care provider. These stockings help to prevent blood clots and reduce swelling in your legs. °· Weigh yourself every day. This helps identify if your body is holding (retaining) fluid that may make your heart and lungs work harder. °· Keep all follow-up visits as told by your health care provider. This is important. °Contact a health care provider if: °· You have more redness, swelling, or pain around any incision. °· You have more fluid or blood coming from any incision. °· Any incision feels warm to the touch. °· You have pus or a bad smell coming from any incision °· You have a fever. °· You have swelling in your ankles or legs. °· You have pain in your legs. °· You gain 2 lb (0.9 kg) or more a day. °· You are nauseous or you vomit. °· You have diarrhea. °Get help right away if: °· You have chest pain that spreads to your jaw or arms. °· You are short of breath. °· You have a fast or irregular heartbeat. °· You notice a "clicking" in your breastbone (sternum) when you move. °· You have numbness or weakness in your arms or legs. °· You feel dizzy or light-headed. °Summary °· After the procedure, it is common to have pain or discomfort in the incision areas. °· Do not take baths, swim, or use a hot tub until your health care provider approves. °· Gradually increase your activities. You may need physical therapy or cardiac rehabilitation to help strengthen your muscles and build your endurance. °· Weigh yourself every day. This helps identify if your body is holding (retaining) fluid that may make your heart and lungs work  harder. °This information is not intended to replace advice given to you by your health care provider. Make sure you discuss any questions you have with your health care provider. °Document Released: 12/14/2004 Document Revised: 04/15/2016 Document Reviewed: 04/15/2016 °Elsevier Interactive Patient Education © 2017 [employer] ° ° °Endoscopic Saphenous Vein Harvesting, Care After °Refer to this sheet in the next few weeks. These instructions provide you with information about caring for yourself after your procedure. Your health care provider may also give you more specific instructions. Your treatment has been planned according to current medical practices, but problems sometimes occur. Call your health care provider if you have any problems or questions after your procedure. °What can I expect after the procedure? °After the procedure, it is common to have: °· Pain. °· Bruising. °· Swelling. °· Numbness. °Follow these instructions at home: °Medicine  °· Take over-the-counter and prescription medicines only as told by your health care provider. °· Do not   drive or operate heavy machinery while taking prescription pain medicine. °Incision care  ° °· Follow instructions from your health care provider about how to take care of the cut made during surgery (incision). Make sure you: °¨ Wash your hands with soap and water before you change your bandage (dressing). If soap and water are not available, use hand sanitizer. °¨ Change your dressing as told by your health care provider. °¨ Leave stitches (sutures), skin glue, or adhesive strips in place. These skin closures may need to be in place for 2 weeks or longer. If adhesive strip edges start to loosen and curl up, you may trim the loose edges. Do not remove adhesive strips completely unless your health care provider tells you to do that. °· Check your incision area every day for signs of infection. Check for: °¨ More redness, swelling, or pain. °¨ More fluid or  blood. °¨ Warmth. °¨ Pus or a bad smell. °General instructions  °· Raise (elevate) your legs above the level of your heart while you are sitting or lying down. °· Do any exercises your health care providers have given you. These may include deep breathing, coughing, and walking exercises. °· Do not shower, take baths, swim, or use a hot tub unless told by your health care provider. °· Wear your elastic stocking if told by your health care provider. °· Keep all follow-up visits as told by your health care provider. This is important. °Contact a health care provider if: °· Medicine does not help your pain. °· Your pain gets worse. °· You have new leg bruises or your leg bruises get bigger. °· You have a fever. °· Your leg feels numb. °· You have more redness, swelling, or pain around your incision. °· You have more fluid or blood coming from your incision. °· Your incision feels warm to the touch. °· You have pus or a bad smell coming from your incision. °Get help right away if: °· Your pain is severe. °· You develop pain, tenderness, warmth, redness, or swelling in any part of your leg. °· You have chest pain. °· You have trouble breathing. °This information is not intended to replace advice given to you by your health care provider. Make sure you discuss any questions you have with your health care provider. °Document Released: 02/06/2011 Document Revised: 11/02/2015 Document Reviewed: 04/10/2015 °Elsevier Interactive Patient Education © 2017 [employer] ° ° °

## 2016-09-27 NOTE — Progress Notes (Signed)
Anesthesiology Follow-up:  POD #3 following CABG X 4  Awake and alert. Patient just went into rapid atrial fibrillation. Having mild SOB and palpitations.  VS: T- 37 BP-131/91 HR- 141 RR- 20 O2 Sat 98% on RA  K-3.6 BUN/Cr 13/0.92 glucose- 101 H/H- 9.9/29 Platelets- 118,000   Extubated 8 hours post-op.   As noted patient has now developed post-op afib was ready for transfer to 2W. Hemodynamically stable.

## 2016-09-27 NOTE — Progress Notes (Signed)
CT Surgery PM Rounds  Back in NSR Stable day  Cont iv amiodarone

## 2016-09-27 NOTE — Discharge Summary (Signed)
Physician Discharge Summary  Patient ID: GAIGE SEBO MRN: [id number] DOB/AGE: Oct 24, 1940 76 y.o.  Admit date: 09/24/2016 Discharge date: 10/01/2016  Admission Diagnoses:  Patient Active Problem List   Diagnosis Date Noted  . Hyperlipidemia   . Hypertension   . Abnormal stress echo 08/13/2016  . CAD in native artery 02/16/2016  . STEMI (ST elevation myocardial infarction) (HCC) 02/16/2016  . History of heart artery stent 02/16/2016   Discharge Diagnoses:   Patient Active Problem List   Diagnosis Date Noted  . 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 Hyperlipidemia   . Hypertension   . Abnormal stress echo 08/13/2016  . CAD in native artery 02/16/2016  . STEMI (ST elevation myocardial infarction) (HCC) 02/16/2016  . History of heart artery stent 02/16/2016   Discharged Condition: good  History of Present Illness:  Mr. Ressler is a 76 yo very active male who was diagnosed with 3 vessel CAD.  He suffered an inferior wall MI in September 2017.  At that time he underwent urgent PCI with stent placement to the RCA x 2.  That hospital stay was complicated by possible aspiration requiring a brief stay in the ICU.  The patient has recovered nicely since discharge and has been actively participating in cardiac rehab.  In follow up with Dr. Rhona Leavens a stress test was performed which showed positive ischemia.  Due to this repeat catheterization was performed in March which showed severe stenosis of his LAD, Diagonal, and Ramus.  The stents placed in his RCA were patent.  His EF was reduced at 35-40 %.  It was felt coronary bypass would be indicated and he was referred to [facility] for evaluation.  He was seen by Dr. Donata Clay who was in agreement coronary bypass grafting would be beneficial.  The risks and benefits of the procedure were explained to the patient and he was agreeable to proceed.    Hospital Course:   The patient presented to [facility] on 09/24/2016.  He was taken to the operating and underwent  CABG x 4 utilizing LIMA to LAD, SVG to Diagonal, SVG to Ramus, and SVG to Circumflex.  He also underwent endoscopic harvest from the left leg.  He tolerated the procedure without difficulty and was taken to the SICU in stable condition.  The patient was extubated the evening of surgery.  During his stay in the SICU the patient was weaned off Milrinone as tolerated.  He was hypertensive and required NTG drip with improvement of BP control.  His chest tubes and arterial lines were removed without difficulty.  He was diuresed for hypervolemia.  He is ambulating independently and was medically stable for transfer to the stepdown unit on POD # 5 . On the floor he continued to progress. We discontinued his epicardial pacing wires since his rhythm remained stable. We worked on mobility and weaned him off his supplemental oxygen. Today, he is ambulating with limited assistance, tolerating room air, his incisions are healing well and he is ready for discharge home.       Treatments: surgery:   1. Coronary artery bypass grafting x4 (left internal mammary artery to LAD, saphenous vein graft to diagonal, saphenous vein graft to ramus intermediate, saphenous vein graft to circumflex marginal). 2. Endoscopic harvest of left leg greater saphenous vein with exposure, but not harvest of right leg greater saphenous vein.  Disposition: 01-Home or Self Care   Discharge Medications:   Allergies as of 10/01/2016      Reactions  Phenergan [promethazine] Other (See Comments)   SEVERE AGITATION      Medication List    STOP taking these medications   prasugrel 10 MG Tabs tablet Commonly known as:  EFFIENT     TAKE these medications   ALPRAZolam 0.5 MG tablet Commonly known as:  XANAX Take 0.25-0.5 mg by mouth 3 (three) times daily as needed for sleep.   amiodarone 200 MG tablet Commonly known as:  PACERONE Please take 2 tabs ( ) twice a day for 3 days then take 1 tab ( ) twice a day for 3 weeks or  until we see you in the office.   aspirin EC 81 MG tablet Take 81 mg by mouth daily.   atorvastatin 80 MG tablet Commonly known as:  LIPITOR Take 80 mg by mouth daily.   clopidogrel 75 MG tablet Commonly known as:  PLAVIX Take 1 tablet (75 mg total) by mouth daily.   diltiazem 180 MG 24 hr capsule Commonly known as:  CARDIZEM CD Take 1 capsule (180 mg total) by mouth daily.   esomeprazole 20 MG capsule Commonly known as:  NEXIUM Take 20 mg by mouth every evening. Notes to patient:  You were not given this medication during your hospital stay. You may resume this medication tomorrow.   furosemide 40 MG tablet Commonly known as:  LASIX Take 1 tablet (40 mg total) by mouth daily.   HYDROcodone-acetaminophen 5-325 MG tablet Commonly known as:  NORCO/VICODIN Take 1 tablet by mouth every 6 (six) hours as needed for moderate pain. What changed:  when to take this  additional instructions   lisinopril 5 MG tablet Commonly known as:  PRINIVIL,ZESTRIL Take 1 tablet (5 mg total) by mouth daily. What changed:  medication strength  how much to take  additional instructions   metoprolol tartrate 25 MG tablet Commonly known as:  LOPRESSOR Take 25 mg by mouth 2 (two) times daily.   potassium chloride SA 20 MEQ tablet Commonly known as:  K-DUR,KLOR-CON Take 1 tablet (20 mEq total) by mouth 2 (two) times daily.            Durable Medical Equipment        Start     Ordered   10/01/16 1218  For home use only DME Walker rolling  Once    Comments:  Post op  Question:  Patient needs a walker to treat with the following condition  Answer:  Weakness   10/01/16 1218     Follow-up Information    Kathlee Nations Trigt III, MD Follow up on 10/30/2016.   Specialty:  Cardiothoracic Surgery Why:  Appointment is at 12:30pm   , Please get CXR 30 min prior to your appointment at [facility] located on first floor of our office building Contact information: [address] [place] [address] [phone]        Holley Raring, MD Follow up.   Specialty:  Cardiology Why:  call on discharge to arrange hospital follow up Contact information: [address] [place] [address] [phone]        Forrest Moron, MD. Call in 1 day(s).   Specialty:  Internal Medicine Contact information: [address] [place] [address] [phone]        [facility] Follow up.   Why:  HHPT arranged- please allow 24-48 hr post discharge for them to contact you to set up home visits Contact information: [address] [facility]  [place] [address] [phone]        [facility] Follow up.   Why:  rolling walker- arranged- to be delivered to room prior to discharge Contact information: [address] [place] [address] [phone]          The patient has been discharged on:   1.Beta Blocker:  Yes [ x  ]                              No   [   ]                              If No, reason:  2.Ace Inhibitor/ARB: Yes [ x  ]                                     No  [    ]                                     If No, reason:  3.Statin:   Yes [x   ]                  No  [   ]                  If No, reason:  4.Ecasa:  Yes  [ x  ]                  No   [   ]                  If No, reason:    Signed: Naveyah Iacovelli E 10/01/2016, 12:27 PM

## 2016-09-27 NOTE — Progress Notes (Signed)
[facility] DAILY ICU PROGRESS NOTE                   [address]            [place] [address]          [phone]   3 Days Post-Op Procedure(s) (LRB): CORONARY ARTERY BYPASS GRAFTING (CABG) x 4 using the left internal mammary artery and the left greater saphenous vein harvested endoscopically. (N/A) TRANSESOPHAGEAL ECHOCARDIOGRAM (TEE) (N/A)  Total Length of Stay:  LOS: 3 days   Subjective: Feeling better  Objective: Vital signs in last 24 hours: Temp:  [97.8 F (36.6 C)-99.1 F (37.3 C)] 97.8 F (36.6 C) (04/20 0400) Pulse Rate:  [57-97] 63 (04/20 0700) Cardiac Rhythm: Sinus bradycardia (04/20 0400) Resp:  [14-21] 15 (04/20 0700) BP: (87-156)/(53-96) 113/70 (04/20 0700) SpO2:  [90 %-99 %] 99 % (04/20 0700) Weight:  [235 lb 0.2 oz (106.6 kg)] 235 lb 0.2 oz (106.6 kg) (04/20 0600)  Filed Weights   09/25/16 0500 09/26/16 0600 09/27/16 0600  Weight: 236 lb 15.9 oz (107.5 kg) 238 lb 15.7 oz (108.4 kg) 235 lb 0.2 oz (106.6 kg)    Weight change: -3 lb 15.5 oz (-1.8 kg)   Hemodynamic parameters for last 24 hours:    Intake/Output from previous day: 04/19 0701 - 04/20 0700 In: 1249.5 [P.O.:600; I.V.:101.5; IV Piggyback:548] Out: 2890 [Urine:2420; Chest Tube:470]  Intake/Output this shift: No intake/output data recorded.  Current Meds: Scheduled Meds: . acetaminophen  1,000 mg Oral Q6H   Or  . acetaminophen (TYLENOL) oral liquid 160 mg/5 mL  1,000 mg Per Tube Q6H  . ALPRAZolam  0.5 mg Oral TID  . aspirin EC  81 mg Oral Daily   Or  . aspirin  324 mg Per Tube Daily  . bisacodyl  10 mg Oral Daily   Or  . bisacodyl  10 mg Rectal Daily  . Chlorhexidine Gluconate Cloth  6 each Topical Daily  . docusate sodium  200 mg Oral Daily  . ferrous fumarate-b12-vitamic C-folic acid  1 capsule Oral BID PC  . furosemide  40 mg Intravenous BID  . insulin aspart  0-24 Units Subcutaneous Q4H  . insulin detemir  10 Units Subcutaneous BID  . metoCLOPramide (REGLAN)  injection  10 mg Intravenous Q6H  . metoprolol tartrate  25 mg Oral BID  . pantoprazole  40 mg Oral Daily  . potassium chloride  20 mEq Oral BID  . simethicone  80 mg Oral QID  . sodium chloride flush  10-40 mL Intracatheter Q12H  . sodium chloride flush  3 mL Intravenous Q12H   Continuous Infusions: . sodium chloride 20 mL/hr at 09/25/16 0600  . sodium chloride Stopped (09/25/16 1000)  . sodium chloride 10 mL/hr at 09/27/16 0700  . sodium chloride    . famotidine (PEPCID) IV Stopped (09/26/16 2155)  . potassium chloride (KCL MULTIRUN) 30 mEq in 265 mL IVPB 30 mEq (09/27/16 0515)   PRN Meds:.sodium chloride, fentaNYL (SUBLIMAZE) injection, hydrALAZINE, metoprolol, midazolam, ondansetron (ZOFRAN) IV, oxyCODONE, sodium chloride flush, sodium chloride flush, traMADol  General appearance: alert, cooperative and no distress Heart: regular rate and rhythm Lungs: clear to auscultation bilaterally Abdomen: benign Extremities: minor edema Wound: incis healing well  Lab Results: CBC: Recent Labs  09/26/16 0307 09/26/16 1525 09/27/16 0355  WBC 13.2*  --  12.1*  HGB 9.6* 9.9* 9.9*  HCT 28.3* 29.0* 29.1*  PLT 110*  --  118*   BMET:  Recent  Labs  09/26/16 0307 09/26/16 1525 09/27/16 0355  NA 130* 131* 131*  K 3.9 3.8 3.6  CL 99* 96* 95*  CO2 25  --  27  GLUCOSE 127* 138* 101*  BUN CREATININE 0.83 0.80 0.92  CALCIUM 7.8*  --  8.3*    CMET: Lab Results  Component Value Date   WBC 12.1 (H) 09/27/2016   HGB 9.9 (L) 09/27/2016   HCT 29.1 (L) 09/27/2016   PLT 118 (L) 09/27/2016   GLUCOSE 101 (H) 09/27/2016   ALT 16 (L) 09/20/2016   AST 21 09/20/2016   NA 131 (L) 09/27/2016   K 3.6 09/27/2016   CL 95 (L) 09/27/2016   CREATININE 0.92 09/27/2016   BUN 13 09/27/2016   CO2 27 09/27/2016   INR 1.34 09/24/2016   HGBA1C 6.1 (H) 09/20/2016      PT/INR:  Recent Labs  09/24/16 1524  LABPROT 16.7*  INR 1.34   Radiology: No results  found.   Assessment/Plan: S/P Procedure(s) (LRB): CORONARY ARTERY BYPASS GRAFTING (CABG) x 4 using the left internal mammary artery and the left greater saphenous vein harvested endoscopically. (N/A) TRANSESOPHAGEAL ECHOCARDIOGRAM (TEE) (N/A) 1 doing well 2 sinus rhythm 3 BP variable, milrinone off 4 prob remove CT today 5 cont to diurese 6 pulm toilet/reab 7 labs very stable  Tamsin Nader E 09/27/2016 7:53 AM

## 2016-09-27 NOTE — Progress Notes (Signed)
3 Days Post-Op Procedure(s) (LRB): CORONARY ARTERY BYPASS GRAFTING (CABG) x 4 using the left internal mammary artery and the left greater saphenous vein harvested endoscopically. (N/A) TRANSESOPHAGEAL ECHOCARDIOGRAM (TEE) (N/A) Subjective: Up in chair feeling better nsr Chest tubes with 150 cc last 12 hrs  Objective: Vital signs in last 24 hours: Temp:  [97.8 F (36.6 C)-99.1 F (37.3 C)] 97.8 F (36.6 C) (04/20 0400) Pulse Rate:  [57-97] 63 (04/20 0700) Cardiac Rhythm: Sinus bradycardia (04/20 0400) Resp:  [14-21] 15 (04/20 0700) BP: (87-156)/(53-96) 113/70 (04/20 0700) SpO2:  [90 %-99 %] 99 % (04/20 0700) Weight:  [235 lb 0.2 oz (106.6 kg)] 235 lb 0.2 oz (106.6 kg) (04/20 0600)  Hemodynamic parameters for last 24 hours:  stable  Intake/Output from previous day: 04/19 0701 - 04/20 0700 In: 1249.5 [P.O.:600; I.V.:101.5; IV Piggyback:548] Out: 2890 [Urine:2420; Chest Tube:470] Intake/Output this shift       Exam    General- alert and comfortable   Lungs- clear without rales, wheezes   Cor- regular rate and rhythm, no murmur , gallop   Abdomen- soft, non-tender   Extremities - warm, non-tender, minimal edema   Neuro- oriented, appropriate, no focal weakness   Lab Results:  Recent Labs  09/26/16 0307 09/26/16 1525 09/27/16 0355  WBC 13.2*  --  12.1*  HGB 9.6* 9.9* 9.9*  HCT 28.3* 29.0* 29.1*  PLT 110*  --  118*   BMET:  Recent Labs  09/26/16 0307 09/26/16 1525 09/27/16 0355  NA 130* 131* 131*  K 3.9 3.8 3.6  CL 99* 96* 95*  CO2 25  --  27  GLUCOSE 127* 138* 101*  BUN CREATININE 0.83 0.80 0.92  CALCIUM 7.8*  --  8.3*    PT/INR:  Recent Labs  09/24/16 1524  LABPROT 16.7*  INR 1.34   ABG    Component Value Date/Time   PHART 7.354 09/24/2016 2345   HCO3 23.8 09/24/2016 2345   TCO2 27 09/26/2016 1525   ACIDBASEDEF 2.0 09/24/2016 2345   O2SAT 94.0 09/24/2016 2345   CBG (last 3)   Recent Labs  09/26/16 1959 09/26/16 2354  09/27/16 0352  GLUCAP 132* 106* 102*    Assessment/Plan: S/P Procedure(s) (LRB): CORONARY ARTERY BYPASS GRAFTING (CABG) x 4 using the left internal mammary artery and the left greater saphenous vein harvested endoscopically. (N/A) TRANSESOPHAGEAL ECHOCARDIOGRAM (TEE) (N/A) Mobilize Diuresis Diabetes control Plan for transfer to step-down: see transfer orders start plavix tomorrow Hold lovenox for persistent serosanguinous chest tube drainage  LOS: 3 days    Kathlee Nations Trigt III 09/27/2016

## 2016-09-27 NOTE — Progress Notes (Signed)
Pt still DTV s/p foley removal at 15:30. Bladder scan volume at 22:35 . Pt denies urge to void at this time. Will continue to monitor per protocol.

## 2016-09-28 ENCOUNTER — Inpatient Hospital Stay (HOSPITAL_COMMUNITY): Payer: Medicare Other

## 2016-09-28 LAB — CBC
HCT: 29.5 % — ABNORMAL LOW (ref 39.0–52.0)
Hemoglobin: 9.9 g/dL — ABNORMAL LOW (ref 13.0–17.0)
MCH: 29.3 pg (ref 26.0–34.0)
MCHC: 33.6 g/dL (ref 30.0–36.0)
MCV: 87.3 fL (ref 78.0–100.0)
Platelets: 138 10*3/uL — ABNORMAL LOW (ref 150–400)
RBC: 3.38 MIL/uL — ABNORMAL LOW (ref 4.22–5.81)
RDW: 13.7 % (ref 11.5–15.5)
WBC: 9.8 10*3/uL (ref 4.0–10.5)

## 2016-09-28 LAB — GLUCOSE, CAPILLARY: Glucose-Capillary: 111 mg/dL — ABNORMAL HIGH (ref 65–99)

## 2016-09-28 LAB — BASIC METABOLIC PANEL
Anion gap: 8 (ref 5–15)
BUN: 12 mg/dL (ref 6–20)
CO2: 26 mmol/L (ref 22–32)
Calcium: 8.1 mg/dL — ABNORMAL LOW (ref 8.9–10.3)
Chloride: 97 mmol/L — ABNORMAL LOW (ref 101–111)
Creatinine, Ser: 0.96 mg/dL (ref 0.61–1.24)
GFR calc Af Amer: >60 mL/min (ref >60)
GFR calc non Af Amer: >60 mL/min (ref >60)
Glucose, Bld: 108 mg/dL — ABNORMAL HIGH (ref 65–99)
Potassium: 3.6 mmol/L (ref 3.5–5.1)
Sodium: 131 mmol/L — ABNORMAL LOW (ref 135–145)

## 2016-09-28 MED ORDER — FUROSEMIDE 40 MG PO TABS
40.0000 mg | ORAL_TABLET | Freq: Every day | ORAL | Status: DC
  Administered 2016-09-29 – 2016-10-01 (×3): 40 mg via ORAL
  Filled 2016-09-28 (×3): qty 1

## 2016-09-28 MED ORDER — ATORVASTATIN CALCIUM 80 MG PO TABS
80.0000 mg | ORAL_TABLET | Freq: Every day | ORAL | Status: DC
  Administered 2016-09-28 – 2016-09-30 (×3): 80 mg via ORAL
  Filled 2016-09-28 (×3): qty 1

## 2016-09-28 MED ORDER — AMIODARONE HCL 200 MG PO TABS
400.0000 mg | ORAL_TABLET | Freq: Two times a day (BID) | ORAL | Status: DC
  Administered 2016-09-28 – 2016-10-01 (×6): 400 mg via ORAL
  Filled 2016-09-28 (×6): qty 2

## 2016-09-28 MED ORDER — AMIODARONE HCL 200 MG PO TABS
400.0000 mg | ORAL_TABLET | Freq: Two times a day (BID) | ORAL | Status: AC
  Administered 2016-09-28: 400 mg via ORAL
  Filled 2016-09-28: qty 2

## 2016-09-28 MED ORDER — POTASSIUM CHLORIDE 20 MEQ/15ML (10%) PO SOLN
20.0000 meq | Freq: Four times a day (QID) | ORAL | Status: AC
Start: 2016-09-28 — End: 2016-09-28

## 2016-09-28 MED ORDER — POTASSIUM CHLORIDE CRYS ER 20 MEQ PO TBCR
20.0000 meq | EXTENDED_RELEASE_TABLET | Freq: Four times a day (QID) | ORAL | Status: AC
  Administered 2016-09-28 (×2): 20 meq via ORAL
  Filled 2016-09-28 (×2): qty 1

## 2016-09-28 NOTE — Progress Notes (Signed)
Pt has been sleeping, no urge to void yet. Will reassess in 2 hours and bladder scan if needed. Will continue to monitor.

## 2016-09-28 NOTE — Progress Notes (Signed)
4 Days Post-Op Procedure(s) (LRB): CORONARY ARTERY BYPASS GRAFTING (CABG) x 4 using the left internal mammary artery and the left greater saphenous vein harvested endoscopically. (N/A) TRANSESOPHAGEAL ECHOCARDIOGRAM (TEE) (N/A) Subjective: nsr walkled 550 ft CXR clear  Objective: Vital signs in last 24 hours: Temp:  [97.7 F (36.5 C)-99.1 F (37.3 C)] 99.1 F (37.3 C) (04/21 0748) Pulse Rate:  [56-160] 74 (04/21 0700) Cardiac Rhythm: Normal sinus rhythm (04/21 0720) Resp:  [16-29] 16 (04/21 0700) BP: (82-149)/(53-114) 131/81 (04/21 0700) SpO2:  [94 %-100 %] 99 % (04/21 0700) Weight:  [231 lb 4.2 oz (104.9 kg)] 231 lb 4.2 oz (104.9 kg) (04/21 0500)  Hemodynamic parameters for last 24 hours:  stable  Intake/Output from previous day: 04/20 0701 - 04/21 0700 In: 1386.1 [P.O.:720; I.V.:441.1; IV Piggyback:100] Out: 2670 [Urine:2410; Chest Tube:260] Intake/Output this shift: Total I/O In: 16.7 [I.V.:16.7] Out: 8 [Chest Tube:8]       Exam    General- alert and comfortable   Lungs- clear without rales, wheezes   Cor- regular rate and rhythm, no murmur , gallop   Abdomen- soft, non-tender   Extremities - warm, non-tender, minimal edema   Neuro- oriented, appropriate, no focal weakness     Lab Results:  Recent Labs  09/27/16 0355 09/28/16 0357  WBC 12.1* 9.8  HGB 9.9* 9.9*  HCT 29.1* 29.5*  PLT 118* 138*   BMET:  Recent Labs  09/27/16 0355 09/28/16 0357  NA 131* 131*  K 3.6 3.6  CL 95* 97*  CO2 27 26  GLUCOSE 101* 108*  BUN 13 12  CREATININE 0.92 0.96  CALCIUM 8.3* 8.1*    PT/INR: No results for input(s): LABPROT, INR in the last 72 hours. ABG    Component Value Date/Time   PHART 7.354 09/24/2016 2345   HCO3 23.8 09/24/2016 2345   TCO2 27 09/26/2016 1525   ACIDBASEDEF 2.0 09/24/2016 2345   O2SAT 94.0 09/24/2016 2345   CBG (last 3)   Recent Labs  09/27/16 2037 09/27/16 2220 09/28/16 0746  GLUCAP 121* 112* 111*    Assessment/Plan: S/P  Procedure(s) (LRB): CORONARY ARTERY BYPASS GRAFTING (CABG) x 4 using the left internal mammary artery and the left greater saphenous vein harvested endoscopically. (N/A) TRANSESOPHAGEAL ECHOCARDIOGRAM (TEE) (N/A) Mobilize Diuresis d/c tubes/lines transition to po amiodarone   LOS: 4 days    Gilbert Hutchinson 09/28/2016

## 2016-09-28 NOTE — Progress Notes (Signed)
SICU PM Rounds  Patient examined and record reviewed.Hemodynamics stable,labs satisfactory.Patient had stable day.Continue current care. Kathlee Nations Trigt III 09/28/2016

## 2016-09-29 LAB — CBC
HCT: 28.4 % — ABNORMAL LOW (ref 39.0–52.0)
Hemoglobin: 9.6 g/dL — ABNORMAL LOW (ref 13.0–17.0)
MCH: 29.4 pg (ref 26.0–34.0)
MCHC: 33.8 g/dL (ref 30.0–36.0)
MCV: 87.1 fL (ref 78.0–100.0)
Platelets: 169 10*3/uL (ref 150–400)
RBC: 3.26 MIL/uL — ABNORMAL LOW (ref 4.22–5.81)
RDW: 13.6 % (ref 11.5–15.5)
WBC: 8.4 10*3/uL (ref 4.0–10.5)

## 2016-09-29 LAB — BASIC METABOLIC PANEL
Anion gap: 9 (ref 5–15)
BUN: 11 mg/dL (ref 6–20)
CO2: 29 mmol/L (ref 22–32)
Calcium: 8.2 mg/dL — ABNORMAL LOW (ref 8.9–10.3)
Chloride: 97 mmol/L — ABNORMAL LOW (ref 101–111)
Creatinine, Ser: 0.99 mg/dL (ref 0.61–1.24)
GFR calc Af Amer: >60 mL/min (ref >60)
GFR calc non Af Amer: >60 mL/min (ref >60)
Glucose, Bld: 129 mg/dL — ABNORMAL HIGH (ref 65–99)
Potassium: 3.7 mmol/L (ref 3.5–5.1)
Sodium: 135 mmol/L (ref 135–145)

## 2016-09-29 MED ORDER — POTASSIUM CHLORIDE 20 MEQ/15ML (10%) PO SOLN: 20.0000 meq | Freq: Four times a day (QID) | ORAL | Status: AC

## 2016-09-29 MED ORDER — MAGNESIUM OXIDE 400 (241.3 MG) MG PO TABS
400.0000 mg | ORAL_TABLET | Freq: Every day | ORAL | Status: AC
  Administered 2016-09-29 – 2016-09-30 (×2): 400 mg via ORAL
  Filled 2016-09-29 (×2): qty 1

## 2016-09-29 MED ORDER — POTASSIUM CHLORIDE CRYS ER 20 MEQ PO TBCR
20.0000 meq | EXTENDED_RELEASE_TABLET | Freq: Four times a day (QID) | ORAL | Status: AC
Start: 2016-09-29 — End: 2016-09-29
  Administered 2016-09-29 (×2): 20 meq via ORAL
  Filled 2016-09-29 (×2): qty 1

## 2016-09-29 MED ORDER — AMIODARONE HCL IN DEXTROSE 360-4.14 MG/200ML-% IV SOLN
30.0000 mg/h | INTRAVENOUS | Status: AC
  Administered 2016-09-29: 30 mg/h via INTRAVENOUS
  Filled 2016-09-29: qty 200

## 2016-09-29 MED ORDER — AMIODARONE HCL IN DEXTROSE 360-4.14 MG/200ML-% IV SOLN: 30.0000 mg/h | INTRAVENOUS | Status: DC

## 2016-09-29 MED ORDER — DILTIAZEM HCL 60 MG PO TABS
60.0000 mg | ORAL_TABLET | Freq: Three times a day (TID) | ORAL | Status: DC
  Administered 2016-09-29 – 2016-09-30 (×2): 60 mg via ORAL
  Filled 2016-09-29 (×2): qty 1

## 2016-09-29 NOTE — Progress Notes (Signed)
5 Days Post-Op Procedure(s) (LRB): CORONARY ARTERY BYPASS GRAFTING (CABG) x 4 using the left internal mammary artery and the left greater saphenous vein harvested endoscopically. (N/A) TRANSESOPHAGEAL ECHOCARDIOGRAM (TEE) (N/A) Subjective: Short episodes of afib last pm Iv amio infusion added Feels well tx to stepdown Objective: Vital signs in last 24 hours: Temp:  [98.4 F (36.9 C)-98.9 F (37.2 C)] 98.8 F (37.1 C) (04/21 1900) Pulse Rate:  [38-98] 38 (04/22 0800) Cardiac Rhythm: Normal sinus rhythm (04/22 0800) Resp:  [16-32] 22 (04/22 0800) BP: (96-151)/(60-92) 106/60 (04/22 0800) SpO2:  [92 %-99 %] 98 % (04/22 0800) Weight:  [225 lb 8.5 oz (102.3 kg)] 225 lb 8.5 oz (102.3 kg) (04/21 2000)  Hemodynamic parameters for last 24 hours:   stable Intake/Output from previous day: 04/21 0701 - 04/22 0700 In: 1520.9 [P.O.:1440; I.V.:30.9; IV Piggyback:50] Out: 2933 [Urine:2925; Chest Tube:8] Intake/Output this shift: No intake/output data recorded.       Exam    General- alert and comfortable   Lungs- clear without rales, wheezes   Cor- regular rate and rhythm, no murmur , gallop   Abdomen- soft, non-tender   Extremities - warm, non-tender, minimal edema   Neuro- oriented, appropriate, no focal weakness   Lab Results:  Recent Labs  09/28/16 0357 09/29/16 0312  WBC 9.8 8.4  HGB 9.9* 9.6*  HCT 29.5* 28.4*  PLT 138* 169   BMET:  Recent Labs  09/28/16 0357 09/29/16 0312  NA 131* 135  K 3.6 3.7  CL 97* 97*  CO2 26 29  GLUCOSE 108* 129*  BUN 12 11  CREATININE 0.96 0.99  CALCIUM 8.1* 8.2*    PT/INR: No results for input(s): LABPROT, INR in the last 72 hours. ABG    Component Value Date/Time   PHART 7.354 09/24/2016 2345   HCO3 23.8 09/24/2016 2345   TCO2 27 09/26/2016 1525   ACIDBASEDEF 2.0 09/24/2016 2345   O2SAT 94.0 09/24/2016 2345   CBG (last 3)   Recent Labs  09/27/16 2037 09/27/16 2220 09/28/16 0746  GLUCAP 121* 112* 111*     Assessment/Plan: S/P Procedure(s) (LRB): CORONARY ARTERY BYPASS GRAFTING (CABG) x 4 using the left internal mammary artery and the left greater saphenous vein harvested endoscopically. (N/A) TRANSESOPHAGEAL ECHOCARDIOGRAM (TEE) (N/A) Mobilize Diuresis Plan for transfer to step-down: see transfer orders   LOS: 5 days    Gilbert Hutchinson 09/29/2016

## 2016-09-30 LAB — CBC
HCT: 29.4 % — ABNORMAL LOW (ref 39.0–52.0)
Hemoglobin: 9.7 g/dL — ABNORMAL LOW (ref 13.0–17.0)
MCH: 29.1 pg (ref 26.0–34.0)
MCHC: 33 g/dL (ref 30.0–36.0)
MCV: 88.3 fL (ref 78.0–100.0)
Platelets: 182 10*3/uL (ref 150–400)
RBC: 3.33 MIL/uL — ABNORMAL LOW (ref 4.22–5.81)
RDW: 13.9 % (ref 11.5–15.5)
WBC: 9.1 10*3/uL (ref 4.0–10.5)

## 2016-09-30 LAB — BASIC METABOLIC PANEL
Anion gap: 9 (ref 5–15)
BUN: 12 mg/dL (ref 6–20)
CO2: 24 mmol/L (ref 22–32)
Calcium: 8.2 mg/dL — ABNORMAL LOW (ref 8.9–10.3)
Chloride: 101 mmol/L (ref 101–111)
Creatinine, Ser: 0.9 mg/dL (ref 0.61–1.24)
GFR calc Af Amer: >60 mL/min (ref >60)
GFR calc non Af Amer: >60 mL/min (ref >60)
Glucose, Bld: 117 mg/dL — ABNORMAL HIGH (ref 65–99)
Potassium: 4.2 mmol/L (ref 3.5–5.1)
Sodium: 134 mmol/L — ABNORMAL LOW (ref 135–145)

## 2016-09-30 LAB — PROTIME-INR
INR: 1.09
Prothrombin Time: 14.1 seconds (ref 11.4–15.2)

## 2016-09-30 MED ORDER — DILTIAZEM HCL 60 MG PO TABS
60.0000 mg | ORAL_TABLET | Freq: Two times a day (BID) | ORAL | Status: DC
  Administered 2016-09-30: 60 mg via ORAL
  Filled 2016-09-30: qty 1

## 2016-09-30 NOTE — Progress Notes (Addendum)
[address]       [place] [address]             [phone]      6 Days Post-Op Procedure(s) (LRB): CORONARY ARTERY BYPASS GRAFTING (CABG) x 4 using the left internal mammary artery and the left greater saphenous vein harvested endoscopically. (N/A) TRANSESOPHAGEAL ECHOCARDIOGRAM (TEE) (N/A) Subjective: Feeling pretty well overall, maintaining SR  Objective: Vital signs in last 24 hours: Temp:  [97.9 F (36.6 C)-99.5 F (37.5 C)] 99.1 F (37.3 C) (04/23 0528) Pulse Rate:  [38-80] 75 (04/23 0528) Cardiac Rhythm: Normal sinus rhythm (04/22 2030) Resp:  [18-22] 18 (04/23 0528) BP: (106-133)/(60-88) 129/75 (04/23 0528) SpO2:  [95 %-98 %] 95 % (04/23 0528) Weight:  [227 lb 8 oz (103.2 kg)] 227 lb 8 oz (103.2 kg) (04/23 0528)  Hemodynamic parameters for last 24 hours:    Intake/Output from previous day: 04/22 0701 - 04/23 0700 In: 436.8 [P.O.:360; I.V.:76.8] Out: 425 [Urine:425] Intake/Output this shift: No intake/output data recorded.  General appearance: alert, cooperative and no distress Heart: regular rate and rhythm Lungs: crackles in right base Abdomen: mild distension , soft, nontender Extremities: mild edema Wound: incis healing well  Lab Results:  Recent Labs  09/29/16 0312 09/30/16 0444  WBC 8.4 9.1  HGB 9.6* 9.7*  HCT 28.4* 29.4*  PLT 169 182   BMET:  Recent Labs  09/29/16 0312 09/30/16 0444  NA 135 134*  K 3.7 4.2  CL 97* 101  CO2 29 24  GLUCOSE 129* 117*  BUN 11 12  CREATININE 0.99 0.90  CALCIUM 8.2* 8.2*    PT/INR:  Recent Labs  09/30/16 0444  LABPROT 14.1  INR 1.09   ABG    Component Value Date/Time   PHART 7.354 09/24/2016 2345   HCO3 23.8 09/24/2016 2345   TCO2 27 09/26/2016 1525   ACIDBASEDEF 2.0 09/24/2016 2345   O2SAT 94.0 09/24/2016 2345   CBG (last 3)   Recent Labs  09/27/16 2037 09/27/16 2220 09/28/16 0746  GLUCAP 121* 112* 111*    Meds Scheduled Meds: . ALPRAZolam  0.5 mg Oral  TID  . amiodarone  400 mg Oral BID  . aspirin EC  81 mg Oral Daily  . atorvastatin  80 mg Oral q1800  . bisacodyl  10 mg Oral Daily   Or  . bisacodyl  10 mg Rectal Daily  . clopidogrel  75 mg Oral Daily  . diltiazem  60 mg Oral Q8H  . docusate sodium  200 mg Oral Daily  . ferrous fumarate-b12-vitamic C-folic acid  1 capsule Oral BID PC  . furosemide  40 mg Oral Daily  . magnesium oxide  400 mg Oral Daily  . metoprolol tartrate  25 mg Oral BID  . pantoprazole  40 mg Oral Daily  . potassium chloride  20 mEq Oral BID  . simethicone  80 mg Oral QID  . sodium chloride flush  10-40 mL Intracatheter Q12H   Continuous Infusions: . sodium chloride Stopped (09/27/16 1200)  . sodium chloride Stopped (09/25/16 1000)  . sodium chloride Stopped (09/27/16 1200)  . sodium chloride     PRN Meds:.sodium chloride, HYDROcodone-acetaminophen, metoprolol, ondansetron (ZOFRAN) IV, traMADol  Xrays No results found.  Assessment/Plan: S/P Procedure(s) (LRB): CORONARY ARTERY BYPASS GRAFTING (CABG) x 4 using the left internal mammary artery and the left greater saphenous vein harvested endoscopically. (N/A) TRANSESOPHAGEAL ECHOCARDIOGRAM (TEE) (N/A)  1 doing well 2 sinus rhythm- d/c wires, cont current  rx 3 hemodyn stable 4 Labs stable 5 sugars stable 6 cont gentle diuresis 7 routine rehab/pulm toilet 8 poss home 1-2 days  LOS: 6 days    GOLD,WAYNE E 09/30/2016 Maintaining sinus rhythm on oral amiodarone, 25 twice a day metoprolol and low-dose Cardizem. Patient will not need Coumadin which would be problematic since he is artery on aspirin and Plavix Plan to remove epicardial pacing wires and discharge home tomorrow Home physical therapy for restorative care patient examined and medical record reviewed,agree with above note. Kathlee Nations Trigt III 09/30/2016

## 2016-09-30 NOTE — Progress Notes (Signed)
Epicardial pacing wires removed per order. Pt tolerated well. Sites clean and dry. Middle chest tube suture site had scant serous drainage. Gauze dressing applied. Vitals being monitored every 15 minutes, per protocol. Pt on bedrest for 1 hour.  Berdine Dance [facility]

## 2016-09-30 NOTE — Progress Notes (Signed)
CARDIAC REHAB PHASE I   PRE:  Rate/Rhythm: 71 SR    BP: sitting 144/95    SaO2: 94 RA  MODE:  Ambulation: 350 ft   POST:  Rate/Rhythm: 88 SR    BP: sitting 151/85     SaO2: 93 RA  Pt moving fairly well, needed mod assist to get out of bed. Instructions given on correct mechanics. Steady walking with RW. No c/o. BP elevated. To recliner. [id number]   Harriet Masson CES, ACSM 09/30/2016 11:44 AM

## 2016-09-30 NOTE — Care Management Important Message (Signed)
Important Message  Patient Details  Name: Gilbert Hutchinson MRN: [id number] Date of Birth: [birth year]   Medicare Important Message Given:  Yes    Dorena Bodo 09/30/2016, 3:25 PM

## 2016-09-30 NOTE — Care Management Note (Signed)
Case Management Note Previous CM note initiated by Cherylann Parr, RN 09/27/2016, 4:28 PM   Patient Details  Name: Gilbert Hutchinson MRN: [id number] Date of Birth: [birth year]  Subjective/Objective:    Pt is s/p CABG                  Action/Plan:  PTA completely independent from home with wife however wife has dementia and a care giver.  Pt has a very supportive family - and will make sure the pt has 24 hour supervision at discharge.  Family is interested in private pay list - CM provided list as requested.  Pt continues to have CT and EPC wires.  CM will continue to follow for discharge needs   Expected Discharge Date:                  Expected Discharge Plan:  Home w Home Health Services  In-House Referral:     Discharge planning Services  CM Consult  Post Acute Care Choice:    Choice offered to:     DME Arranged:    DME Agency:     HH Arranged:    HH Agency:     Status of Service:  In process, will continue to follow  If discussed at Long Length of Stay Meetings, dates discussed:  4/24  Discharge Disposition:   Additional Comments:  09/30/16- 1130- [name] RN, CM- pt tx from [facility] to 2W on 09/29/16- EPW to be removed today, plan remains for home- possibly in 1-2 days.   [name], RN 09/30/2016, 11:28 AM [phone]

## 2016-10-01 MED ORDER — POTASSIUM CHLORIDE CRYS ER 20 MEQ PO TBCR: 20.0000 meq | EXTENDED_RELEASE_TABLET | Freq: Two times a day (BID) | ORAL | 1 refills | Status: AC

## 2016-10-01 MED ORDER — CLOPIDOGREL BISULFATE 75 MG PO TABS: 75.0000 mg | ORAL_TABLET | Freq: Every day | ORAL | 1 refills | Status: AC

## 2016-10-01 MED ORDER — LISINOPRIL 5 MG PO TABS: 5.0000 mg | ORAL_TABLET | Freq: Every day | ORAL | 1 refills | Status: AC

## 2016-10-01 MED ORDER — AMIODARONE HCL 200 MG PO TABS: ORAL_TABLET | ORAL | 1 refills | Status: AC

## 2016-10-01 MED ORDER — HYDROCODONE-ACETAMINOPHEN 5-325 MG PO TABS: 1.0000 | ORAL_TABLET | Freq: Four times a day (QID) | ORAL | 0 refills | Status: AC | PRN

## 2016-10-01 MED ORDER — LISINOPRIL 5 MG PO TABS
5.0000 mg | ORAL_TABLET | Freq: Every day | ORAL | Status: DC
  Administered 2016-10-01: 5 mg via ORAL
  Filled 2016-10-01: qty 1

## 2016-10-01 MED ORDER — DILTIAZEM HCL ER COATED BEADS 180 MG PO CP24: 180.0000 mg | ORAL_CAPSULE | Freq: Every day | ORAL | 1 refills | Status: AC

## 2016-10-01 MED ORDER — DILTIAZEM HCL 60 MG PO TABS: 60.0000 mg | ORAL_TABLET | Freq: Three times a day (TID) | ORAL | Status: DC

## 2016-10-01 MED ORDER — FUROSEMIDE 40 MG PO TABS: 40.0000 mg | ORAL_TABLET | Freq: Every day | ORAL | 1 refills | Status: AC

## 2016-10-01 MED ORDER — DILTIAZEM HCL 60 MG PO TABS: 60.0000 mg | ORAL_TABLET | Freq: Three times a day (TID) | ORAL | 1 refills | Status: DC

## 2016-10-01 MED ORDER — DILTIAZEM HCL ER COATED BEADS 180 MG PO CP24: 180.0000 mg | ORAL_CAPSULE | Freq: Every day | ORAL | Status: DC

## 2016-10-01 NOTE — Progress Notes (Signed)
Ed completed with pt. Voiced understanding. Not interested in CRPII as he just did it last fall.  [id number] Ethelda Chick CES, ACSM 10:12 AM 10/01/2016

## 2016-10-01 NOTE — Care Management Note (Signed)
Case Management Note Previous CM note initiated by Cherylann Parr, RN 09/27/2016, 4:28 PM   Patient Details  Name: Gilbert Hutchinson MRN: [id number] Date of Birth: [birth year]  Subjective/Objective:    Pt is s/p CABG                  Action/Plan:  PTA completely independent from home with wife however wife has dementia and a care giver.  Pt has a very supportive family - and will make sure the pt has 24 hour supervision at discharge.  Family is interested in private pay list - CM provided list as requested.  Pt continues to have CT and EPC wires.  CM will continue to follow for discharge needs   Expected Discharge Date:  10/01/16               Expected Discharge Plan:  Home w Home Health Services  In-House Referral:     Discharge planning Services  CM Consult  Post Acute Care Choice:  Durable Medical Equipment, Home Health Choice offered to:  Patient, Adult Children  DME Arranged:  Walker rolling DME Agency:  [facility]  HH Arranged:  PT [facility] Agency:  [facility]  Status of Service:  Completed, signed off  If discussed at Long Length of Stay Meetings, dates discussed:  4/24  Discharge Disposition: home with home health   Additional Comments:  10/01/16- 1220- Shandricka Monroy RN, CM- pt for d/c home today- order placed of HHPT- spoke with pt and daughter at bedside- list provided for choice- they would like to use [facility] for [facility] services- referral called to Brad with [facility] for HHPT- pt also will need RW for home- Brad aware and to bring RW to room prior to discharge.   09/30/16- 1130- [name] RN, CM- pt tx from [facility] to 2W on 09/29/16- EPW to be removed today, plan remains for home- possibly in 1-2 days.   Zenda Alpers [place], RN 10/01/2016, 12:19 PM [phone]

## 2016-10-01 NOTE — Progress Notes (Signed)
Pt experienced run of atrial flutter lasting 5 minutes. Pt spontaneously converted back. Pt asymptomatic. Will continue to monitor.

## 2016-10-01 NOTE — Progress Notes (Signed)
Chest tube sutures removed per MD order. Benzoin and steri-strips applied. Scant serous drainage on left suture site. Gauze dressing applied over site. Incisions painted with betadine.   Berdine Dance BSN, RN

## 2016-10-01 NOTE — Progress Notes (Signed)
[address]       [place] [address]             [phone]      7 Days Post-Op Procedure(s) (LRB): CORONARY ARTERY BYPASS GRAFTING (CABG) x 4 using the left internal mammary artery and the left greater saphenous vein harvested endoscopically. (N/A) TRANSESOPHAGEAL ECHOCARDIOGRAM (TEE) (N/A) Subjective: Feeling pretty well but having some afib/flutter  Objective: Vital signs in last 24 hours: Temp:  [98.6 F (37 C)-99 F (37.2 C)] 99 F (37.2 C) (04/24 0618) Pulse Rate:  [74-81] 81 (04/24 0618) Cardiac Rhythm: Normal sinus rhythm;Bundle branch block (04/24 0206) Resp:  [18] 18 (04/24 0618) BP: (121-147)/(72-80) 147/77 (04/24 0618) SpO2:  [94 %-96 %] 94 % (04/24 0618) Weight:  [225 lb 1.6 oz (102.1 kg)] 225 lb 1.6 oz (102.1 kg) (04/24 0618)  Hemodynamic parameters for last 24 hours:    Intake/Output from previous day: 04/23 0701 - 04/24 0700 In: 720 [P.O.:720] Out: 1100 [Urine:1100] Intake/Output this shift: No intake/output data recorded.  General appearance: alert, cooperative and no distress Heart: regular rate and rhythm Lungs: minor crackles and dim in bases Abdomen: benign Extremities: min edema Wound: incis healing well  Lab Results:  Recent Labs  09/29/16 0312 09/30/16 0444  WBC 8.4 9.1  HGB 9.6* 9.7*  HCT 28.4* 29.4*  PLT 169 182   BMET:  Recent Labs  09/29/16 0312 09/30/16 0444  NA 135 134*  K 3.7 4.2  CL 97* 101  CO2 29 24  GLUCOSE 129* 117*  BUN 11 12  CREATININE 0.99 0.90  CALCIUM 8.2* 8.2*    PT/INR:  Recent Labs  09/30/16 0444  LABPROT 14.1  INR 1.09   ABG    Component Value Date/Time   PHART 7.354 09/24/2016 2345   HCO3 23.8 09/24/2016 2345   TCO2 27 09/26/2016 1525   ACIDBASEDEF 2.0 09/24/2016 2345   O2SAT 94.0 09/24/2016 2345   CBG (last 3)   Recent Labs  09/28/16 0746  GLUCAP 111*    Meds Scheduled Meds: . ALPRAZolam  0.5 mg Oral TID  . amiodarone  400 mg Oral BID  . aspirin EC   81 mg Oral Daily  . atorvastatin  80 mg Oral q1800  . bisacodyl  10 mg Oral Daily   Or  . bisacodyl  10 mg Rectal Daily  . clopidogrel  75 mg Oral Daily  . diltiazem  60 mg Oral Q12H  . docusate sodium  200 mg Oral Daily  . ferrous fumarate-b12-vitamic C-folic acid  1 capsule Oral BID PC  . furosemide  40 mg Oral Daily  . metoprolol tartrate  25 mg Oral BID  . pantoprazole  40 mg Oral Daily  . potassium chloride  20 mEq Oral BID  . simethicone  80 mg Oral QID  . sodium chloride flush  10-40 mL Intracatheter Q12H   Continuous Infusions: . sodium chloride Stopped (09/27/16 1200)  . sodium chloride Stopped (09/25/16 1000)  . sodium chloride Stopped (09/27/16 1200)  . sodium chloride     PRN Meds:.sodium chloride, HYDROcodone-acetaminophen, metoprolol, ondansetron (ZOFRAN) IV, traMADol  Xrays No results found.  Assessment/Plan: S/P Procedure(s) (LRB): CORONARY ARTERY BYPASS GRAFTING (CABG) x 4 using the left internal mammary artery and the left greater saphenous vein harvested endoscopically. (N/A) TRANSESOPHAGEAL ECHOCARDIOGRAM (TEE) (N/A)  1 overall doing well 2 some afib/flutter, on amio/cardizem/loppressor- cont current for now, observe- may need further adjustment. On plavix and ASA, could consider change  to Eliquis 3 BP would probably tol low dose ACE-I at this point, will add 4 gentle diuresis 5 rehab/pulm toilet- routine  LOS: 7 days    GOLD,WAYNE E 10/01/2016

## 2016-10-01 NOTE — Progress Notes (Signed)
Pt has been discharged home with family. IV and telemetry box removed. Pt and pt's daughter received discharge instructions and all questions were answered. Pt discharged home with rolling walker. Pt left with all of his belongings. Pt left the unit via wheelchair and was accompanied by a Ferne Coe, pt's daughter, and pt's nurse tech.   Berdine Dance BSN, RN

## 2016-10-03 DIAGNOSIS — Z48812 Encounter for surgical aftercare following surgery on the circulatory system: Secondary | ICD-10-CM | POA: Diagnosis not present

## 2016-10-09 ENCOUNTER — Encounter (HOSPITAL_COMMUNITY): Payer: Self-pay | Admitting: Cardiothoracic Surgery

## 2016-10-09 NOTE — Addendum Note (Signed)
Addendum  created 10/09/16 0843 by Adair Laundry, CRNA   Anesthesia Event edited, Anesthesia Staff edited

## 2016-10-15 ENCOUNTER — Other Ambulatory Visit: Payer: Self-pay | Admitting: Cardiothoracic Surgery

## 2016-10-15 DIAGNOSIS — Z951 Presence of aortocoronary bypass graft: Secondary | ICD-10-CM

## 2016-10-23 ENCOUNTER — Ambulatory Visit (INDEPENDENT_AMBULATORY_CARE_PROVIDER_SITE_OTHER): Payer: Self-pay | Admitting: Cardiothoracic Surgery

## 2016-10-23 ENCOUNTER — Ambulatory Visit
Admission: RE | Admit: 2016-10-23 | Discharge: 2016-10-23 | Disposition: A | Discharge status: Home / Self Care | Payer: Medicare Other | Source: Ambulatory Visit | Attending: Cardiothoracic Surgery | Admitting: Cardiothoracic Surgery

## 2016-10-23 ENCOUNTER — Encounter: Payer: Self-pay | Admitting: Cardiothoracic Surgery

## 2016-10-23 VITALS — BP 141/82 | HR 63 | Resp 16 | Ht 77.0 in | Wt 205.0 lb

## 2016-10-23 DIAGNOSIS — Z951 Presence of aortocoronary bypass graft: Secondary | ICD-10-CM

## 2016-10-23 DIAGNOSIS — I2510 Atherosclerotic heart disease of native coronary artery without angina pectoris: Secondary | ICD-10-CM

## 2016-10-23 NOTE — Progress Notes (Signed)
PCP is Forrest Moronuehle, Stephen, MD Referring Provider is Caryl Adahiu, Jenyung Andy, MD  Chief Complaint  Patient presents with  . Routine Post Op    4 wk f/u s/p CABG X 4 .Marland Kitchen.Marland Kitchen.09/24/16 with a CXR    HPI:One month postop check after multivessel CABG for progressive angina and three-vessel CAD. The patient is done very well. He denies symptoms of angina or CHF. Both chest and leg incisions are healing well. Chest x-ray today reveals clear lung fields no pleural effusion sternal wires intact. He has had no edema.  He was discharged home on amiodarone for postop atrial fibrillation but has maintained sinus and has no palpitations. His main complaint is insomnia and poor appetite. We will stop his amiodarone potassium and Lasix and continue his other medications including metoprolol, Cardizem, aspirin, Lipitor lisinopril and Nexium. The patient is followed by Dr. Holley RaringAndy Chiu who placed a stent in his RCA when he had an MI last fall. He will continue Plavix until he is reassess by Dr. Deno Etiennehu.  Patient was told he could resume driving and lifting up to 15-20 pounds. We will try to refer a exercise  Physiologist to the patient for home rehabilitation therapies.   Past Medical History:  Diagnosis Date  . Abnormal stress echo 08/13/2016  . Arthritis   . Asthma    as a child  . 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 CAD in native artery   . GERD (gastroesophageal reflux disease)   . History of heart artery stent 02/16/2016  . Hyperlipidemia   . Hypertension   . Pneumonia   . STEMI (ST elevation myocardial infarction) (HCC) 02/16/2016    Past Surgical History:  Procedure Laterality Date  . CARDIAC CATHETERIZATION  09/04/2016   [place]  . COLONOSCOPY    . CORONARY ARTERY BYPASS GRAFT N/A 09/24/2016   Procedure: CORONARY ARTERY BYPASS GRAFTING (CABG) x 4 using the left internal mammary artery and the left greater saphenous vein harvested endoscopically.;  Surgeon: Kerin PernaPeter Van Trigt, MD;   Location[facility] OR;  Service: Open Heart Surgery;  Laterality: N/A;  . TEE WITHOUT CARDIOVERSION N/A 09/24/2016   Procedure: TRANSESOPHAGEAL ECHOCARDIOGRAM (TEE);  Surgeon: Kerin PernaPeter Van Trigt, MD;  [facility] OR;  Service: Open Heart Surgery;  Laterality: N/A;    Family History  Problem Relation Age of Onset  . Lung cancer Father     Social History Social History  Substance Use Topics  . Smoking status: Never Smoker  . Smokeless tobacco: Never Used  . Alcohol use No    Current Outpatient Prescriptions  Medication Sig Dispense Refill  . ALPRAZolam (XANAX) 0.5 MG tablet Take 0.25-0.5 mg by mouth 3 (three) times daily as needed for sleep.    Marland Kitchen. amiodarone (PACERONE) 200 MG tablet Please take 2 tabs (400mg ) twice a day for 3 days then take 1 tab (200mg ) twice a day for 3 weeks or until we see you in the office. 60 tablet 1  . aspirin EC 81 MG tablet Take 81 mg by mouth daily.    Marland Kitchen. atorvastatin (LIPITOR) 80 MG tablet Take 80 mg by mouth daily.    . clopidogrel (PLAVIX) 75 MG tablet Take 1 tablet (75 mg total) by mouth daily. 30 tablet 1  . diltiazem (CARDIZEM CD) 180 MG 24 hr capsule Take 1 capsule (180 mg total) by mouth daily. 30 capsule 1  . esomeprazole (NEXIUM) 20 MG capsule Take 20 mg by mouth every evening.     .Marland Kitchen  furosemide (LASIX) 40 MG tablet Take 1 tablet (40 mg total) by mouth daily. 30 tablet 1  . HYDROcodone-acetaminophen (NORCO/VICODIN) 5-325 MG tablet Take 1 tablet by mouth every 6 (six) hours as needed for moderate pain. 30 tablet 0  . lisinopril (PRINIVIL,ZESTRIL) 5 MG tablet Take 1 tablet (5 mg total) by mouth daily. 30 tablet 1  . metoprolol tartrate (LOPRESSOR) 25 MG tablet Take 25 mg by mouth 2 (two) times daily.    . potassium chloride SA (K-DUR,KLOR-CON) 20 MEQ tablet Take 1 tablet (20 mEq total) by mouth 2 (two) times daily. 30 tablet 1   No current facility-administered medications for this visit.     Allergies  Allergen Reactions  . Phenergan [Promethazine] Other  (See Comments)    SEVERE AGITATION    Review of Systems  No angina No shortness of breath Gradually increasing strength and stamina  BP (!) 141/82   Pulse 63   Resp 16   Ht 6\' 5"  (1.956 m)   Wt 205 lb (93 kg)   SpO2 95% Comment: ON RA  BMI 24.31 kg/m  Physical Exam      Exam    General- alert and comfortable   Lungs- clear without rales, wheezes   Cor- regular rate and rhythm, no murmur , gallop   Abdomen- soft, non-tender   Extremities - warm, non-tender, minimal edema   Neuro- oriented, appropriate, no focal weakness    Diagnostic Tests: Chest x-ray clear  Impression: Excellent early or correct multivessel CABG Patient will modify his medications as described above. He'll return in approximately 4 weeks before [place].  Plan: Continue medications and activities as described above. We will contact his cardiologist's office for a postop visit  Mikey Bussing, MD [facility] [phone]

## 2016-10-30 ENCOUNTER — Ambulatory Visit: Payer: Medicare Other | Admitting: Cardiothoracic Surgery

## 2016-11-27 ENCOUNTER — Other Ambulatory Visit: Payer: Self-pay | Admitting: Physician Assistant

## 2016-12-04 ENCOUNTER — Ambulatory Visit (INDEPENDENT_AMBULATORY_CARE_PROVIDER_SITE_OTHER): Payer: Self-pay | Admitting: Surgical

## 2016-12-04 ENCOUNTER — Encounter: Payer: Self-pay | Admitting: Cardiothoracic Surgery

## 2016-12-04 VITALS — BP 137/73 | HR 60 | Resp 20 | Ht 77.0 in | Wt 216.0 lb

## 2016-12-04 DIAGNOSIS — Z951 Presence of aortocoronary bypass graft: Secondary | ICD-10-CM

## 2016-12-04 DIAGNOSIS — I2510 Atherosclerotic heart disease of native coronary artery without angina pectoris: Secondary | ICD-10-CM

## 2016-12-04 NOTE — Progress Notes (Signed)
[address]       [place] [address]             [phone]           [address] [place]       [place] [address]             [phone]      LADERRICK WILK [facility] Medical Record #[id number] Date of Birth: 1940-07-12  Referring: Caryl Ada, MD Primary Care: Forrest Moron, MD  Chief Complaint:   POST OP FOLLOW UP DATE OF PROCEDURE:  09/24/2016 DATE OF DISCHARGE:                              OPERATIVE REPORT   OPERATION: 1. Coronary artery bypass grafting x4 (left internal mammary artery to     LAD, saphenous vein graft to diagonal, saphenous vein graft to     ramus intermediate, saphenous vein graft to circumflex marginal). 2. Endoscopic harvest of left leg greater saphenous vein with     exposure, but not harvest of right leg greater saphenous vein.  SURGEON:  Kerin Perna, M.D.  ASSISTANT:  Wayne E. gold, PA-C.  ANESTHESIA:  General by Dr. Kipp Brood.   History of Present Illness:    Patient is a 76 year old male status post the above procedure. He is seen in the office on today's date in follow-up. He was seen previously by Dr. Zenaida Niece tried and was doing quite well. He was planning to take a long trip to [place] and [place], therefore Dr. Zenaida Niece tray wanted to see him in the office one additional time. He reports that overall he is doing quite well. There are no specific difficulties currently. He has been followed by cardiology. He denies chest pain or shortness of breath. He has some mild lower extremity edema but is on Lasix. He has been working out with a Insurance account manager and a low level activity including walking and gentle hand weights without difficulty.      Past Medical History:  Diagnosis Date  . Abnormal stress echo 08/13/2016  . Arthritis   . Asthma    as a child  . 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 CAD in native artery   . GERD (gastroesophageal reflux disease)     . History of heart artery stent 02/16/2016  . Hyperlipidemia   . Hypertension   . Pneumonia   . STEMI (ST elevation myocardial infarction) (HCC) 02/16/2016     History  Smoking Status  . Never Smoker  Smokeless Tobacco  . Never Used    History  Alcohol Use No     Allergies  Allergen Reactions  . Phenergan [Promethazine] Other (See Comments)    SEVERE AGITATION    Current Outpatient Prescriptions  Medication Sig Dispense Refill  . ALPRAZolam (XANAX) 0.5 MG tablet Take 0.25-0.5 mg by mouth 3 (three) times daily as needed for sleep.    Marland Kitchen amiodarone (PACERONE) 200 MG tablet Please take 2 tabs (400mg ) twice a day for 3 days then take 1 tab (200mg ) twice a day for 3 weeks or until we see you in the office. 60 tablet 1  . aspirin EC 81 MG tablet Take 81 mg by mouth daily.    Marland Kitchen atorvastatin (LIPITOR) 80 MG tablet Take 80 mg by mouth daily.    . clopidogrel (  PLAVIX) 75 MG tablet Take 1 tablet (75 mg total) by mouth daily. 30 tablet 1  . diltiazem (CARDIZEM CD) 180 MG 24 hr capsule Take 1 capsule (180 mg total) by mouth daily. 30 capsule 1  . esomeprazole (NEXIUM) 20 MG capsule Take 20 mg by mouth every evening.     . furosemide (LASIX) 40 MG tablet Take 1 tablet (40 mg total) by mouth daily. 30 tablet 1  . HYDROcodone-acetaminophen (NORCO/VICODIN) 5-325 MG tablet Take 1 tablet by mouth every 6 (six) hours as needed for moderate pain. 30 tablet 0  . lisinopril (PRINIVIL,ZESTRIL) 5 MG tablet Take 1 tablet (5 mg total) by mouth daily. 30 tablet 1  . metoprolol tartrate (LOPRESSOR) 25 MG tablet Take 25 mg by mouth 2 (two) times daily.    . potassium chloride SA (K-DUR,KLOR-CON) 20 MEQ tablet Take 1 tablet (20 mEq total) by mouth 2 (two) times daily. 30 tablet 1   No current facility-administered medications for this visit.        Physical Exam: BP 137/73   Pulse 60   Resp 20   Ht 6\' 5"  (1.956 m)   Wt 216 lb (98 kg)   SpO2 98% Comment: RA  BMI 25.61 kg/m   General  appearance: alert, cooperative and no distress Heart: regular rate and rhythm Lungs: clear to auscultation bilaterally Abdomen: benign Extremities: minor ankle edema Wound: incis healing well   Diagnostic Studies & Laboratory data:     Recent Radiology Findings:   No results found.    Recent Lab Findings: Lab Results  Component Value Date   WBC 9.1 09/30/2016   HGB 9.7 (L) 09/30/2016   HCT 29.4 (L) 09/30/2016   PLT 182 09/30/2016   GLUCOSE 117 (H) 09/30/2016   ALT 16 (L) 09/20/2016   AST 21 09/20/2016   NA 134 (L) 09/30/2016   K 4.2 09/30/2016   CL 101 09/30/2016   CREATININE 0.90 09/30/2016   BUN 12 09/30/2016   CO2 24 09/30/2016   INR 1.09 09/30/2016   HGBA1C 6.1 (H) 09/20/2016      Assessment / Plan:  The patient continues to make very good progress overall. He appears quite stable for his trip. We will see him again in the office on a when necessary basis or at request.          GOLD,WAYNE E, PA-C 12/04/2016 2:24 PM

## 2016-12-04 NOTE — Patient Instructions (Signed)
Follow-up prn Continue managemant

## 2017-01-08 ENCOUNTER — Encounter: Payer: Self-pay | Admitting: Cardiothoracic Surgery

## 2017-01-30 NOTE — Addendum Note (Signed)
Addendum  created 01/30/17 1309 by Callaway Hardigree, MD   Sign clinical note    

## 2018-03-04 IMAGING — CR DG CHEST 2V
2 series · 2 of 2 positions shown · non-contrast
Comparison: 08/22/2016

CLINICAL DATA: Preop evaluation for upcoming coronary bypass graft

EXAM:
CHEST  2 VIEW

[w chest pa]
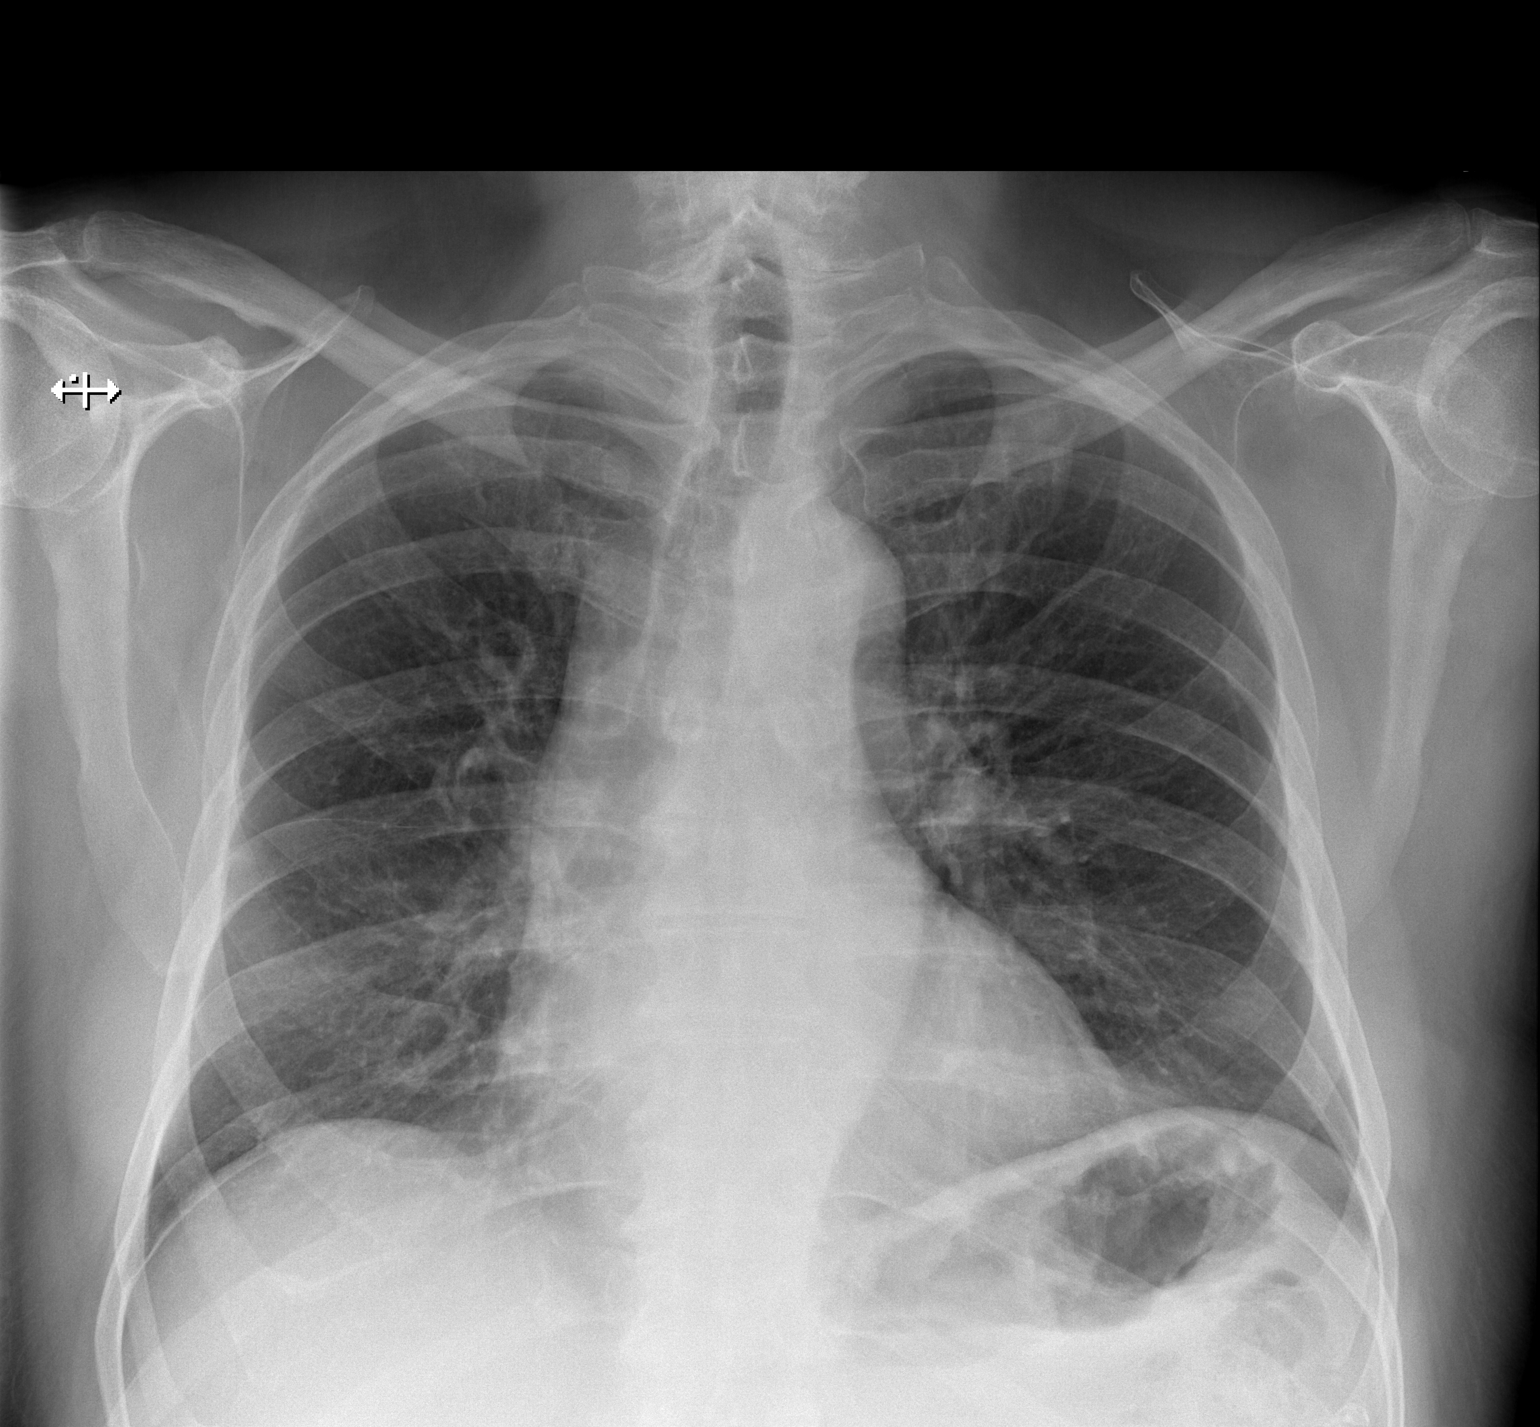

[w chest lat]
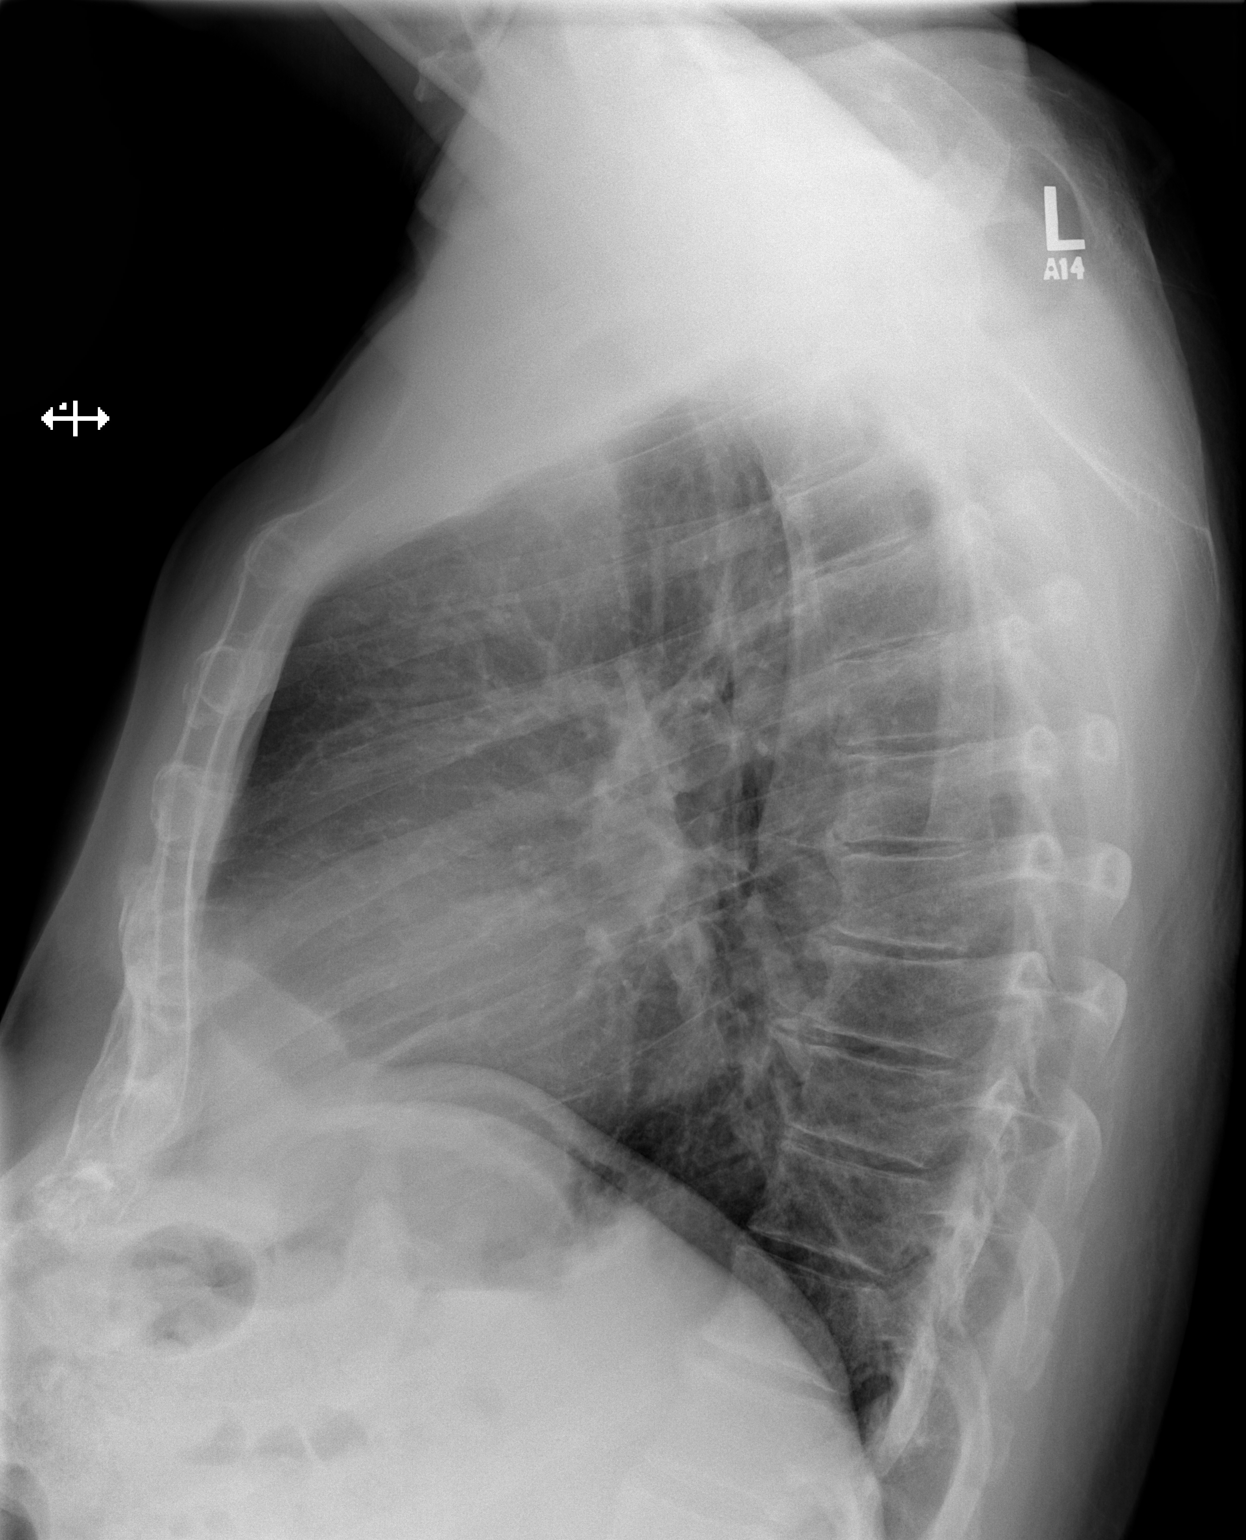

[2 of 2 positions shown; findings below may reference images not displayed]

FINDINGS: The heart size and mediastinal contours are within normal limits.
Both lungs are clear. The visualized skeletal structures shows
degenerative change of the thoracic spine.
IMPRESSION: No active cardiopulmonary disease.

## 2018-03-09 IMAGING — CR DG CHEST 1V PORT
1 series · 1 of 1 positions shown · non-contrast
Comparison: 09/24/2016.

CLINICAL DATA: CABG.  Chest tube.

EXAM:
PORTABLE CHEST 1 VIEW

[AP]
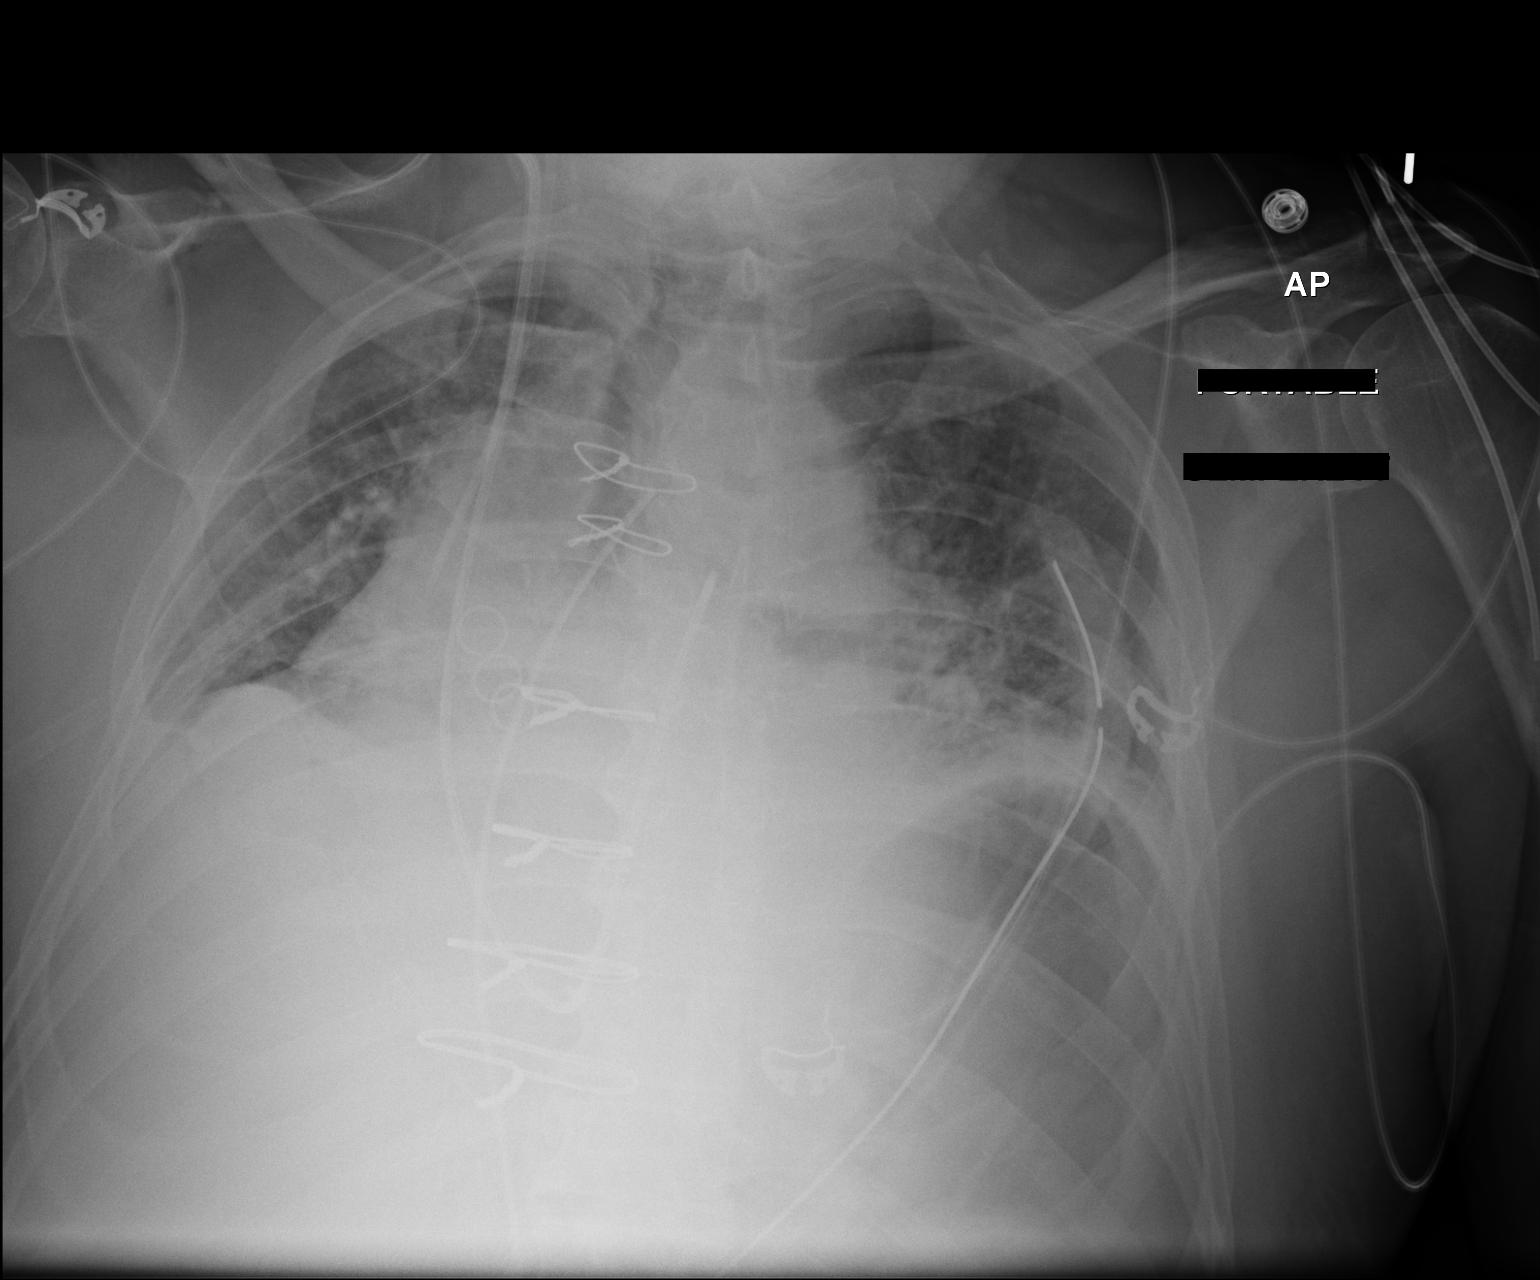

[1 of 1 positions shown; findings below may reference images not displayed]

FINDINGS: Interim extubation and removal of NG tube. Right IJ line noted with
tip over the right atrium. Swan-Ganz catheter noted with its tip
over the pulmonary outflow tract. Left chest tube noted over the
left mid chest in stable position. Mediastinal drainage catheter and
stable position. No pneumothorax. Cardiomegaly. Low lung volumes
with persistent basilar atelectasis. Developing infiltrate left lung
base cannot be excluded. Tiny right pleural effusion. No acute bony
abnormality. Mild gastric distention.
IMPRESSION: 1. Interim extubation removal of NG tube. Remaining lines and tubes
as above. Left chest tube in stable position. No pneumothorax.

2. Prior CABG.  Stable cardiomegaly.

3. Low lung volumes with basilar atelectasis. Developing left lower
lobe infiltrate cannot be excluded. Small right pleural effusion
cannot be excluded .

4.  Mild gastric distention.

## 2018-03-10 IMAGING — CR DG CHEST 1V PORT
1 series · 1 of 1 positions shown · non-contrast
Comparison: 09/25/2016 .  09/24/2016.

CLINICAL DATA: Chest tube.

EXAM:
PORTABLE CHEST 1 VIEW

[AP]
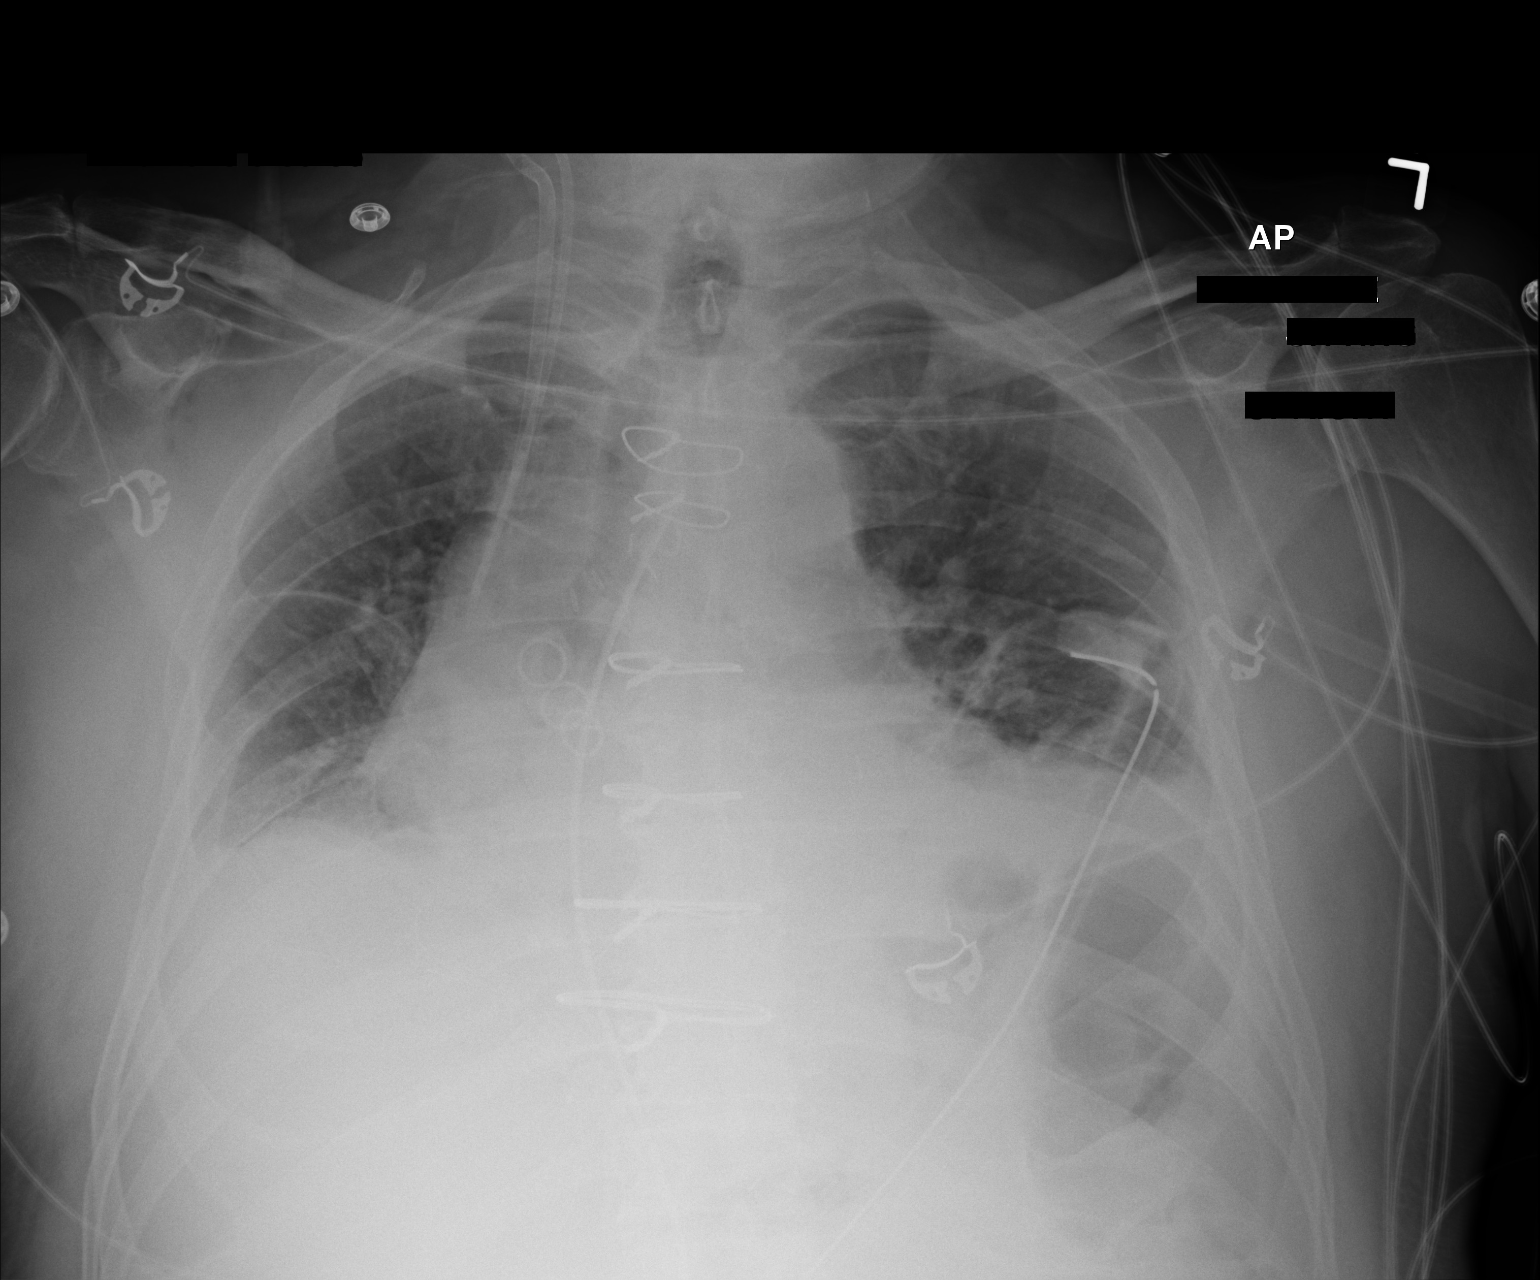

[1 of 1 positions shown; findings below may reference images not displayed]

FINDINGS: Interim removal of Swan-Ganz catheter. Right IJ sheath in stable
position. Right IJ line stable position. Mediastinal drainage
catheter and left chest tube in stable position. No pneumothorax.
Prior CABG. Stable cardiomegaly. Low lung volumes with persistent
basilar atelectasis. No definite infiltrate noted on today's exam.
Small bilateral pleural effusions.
IMPRESSION: 1. Interim removal of Swan-Ganz catheter. Right IJ sheath, right IJ
line, mediastinal drainage catheter, left chest tube in stable
position. No pneumothorax.

2. Prior CABG.  Stable cardiomegaly.

3. Persistent low lung volumes with basilar atelectasis. No definite
infiltrate noted on today's exam. Small bilateral pleural effusions.

4.  No gastric distention noted on today's exam.

## 2018-03-12 IMAGING — CR DG CHEST 2V
2 series · 2 of 2 positions shown · non-contrast
Comparison: Single-view of the chest [DATE] she/ 20/8115.

CLINICAL DATA: Status post CABG 09/24/2016.

EXAM:
CHEST  2 VIEW

[chest pa]
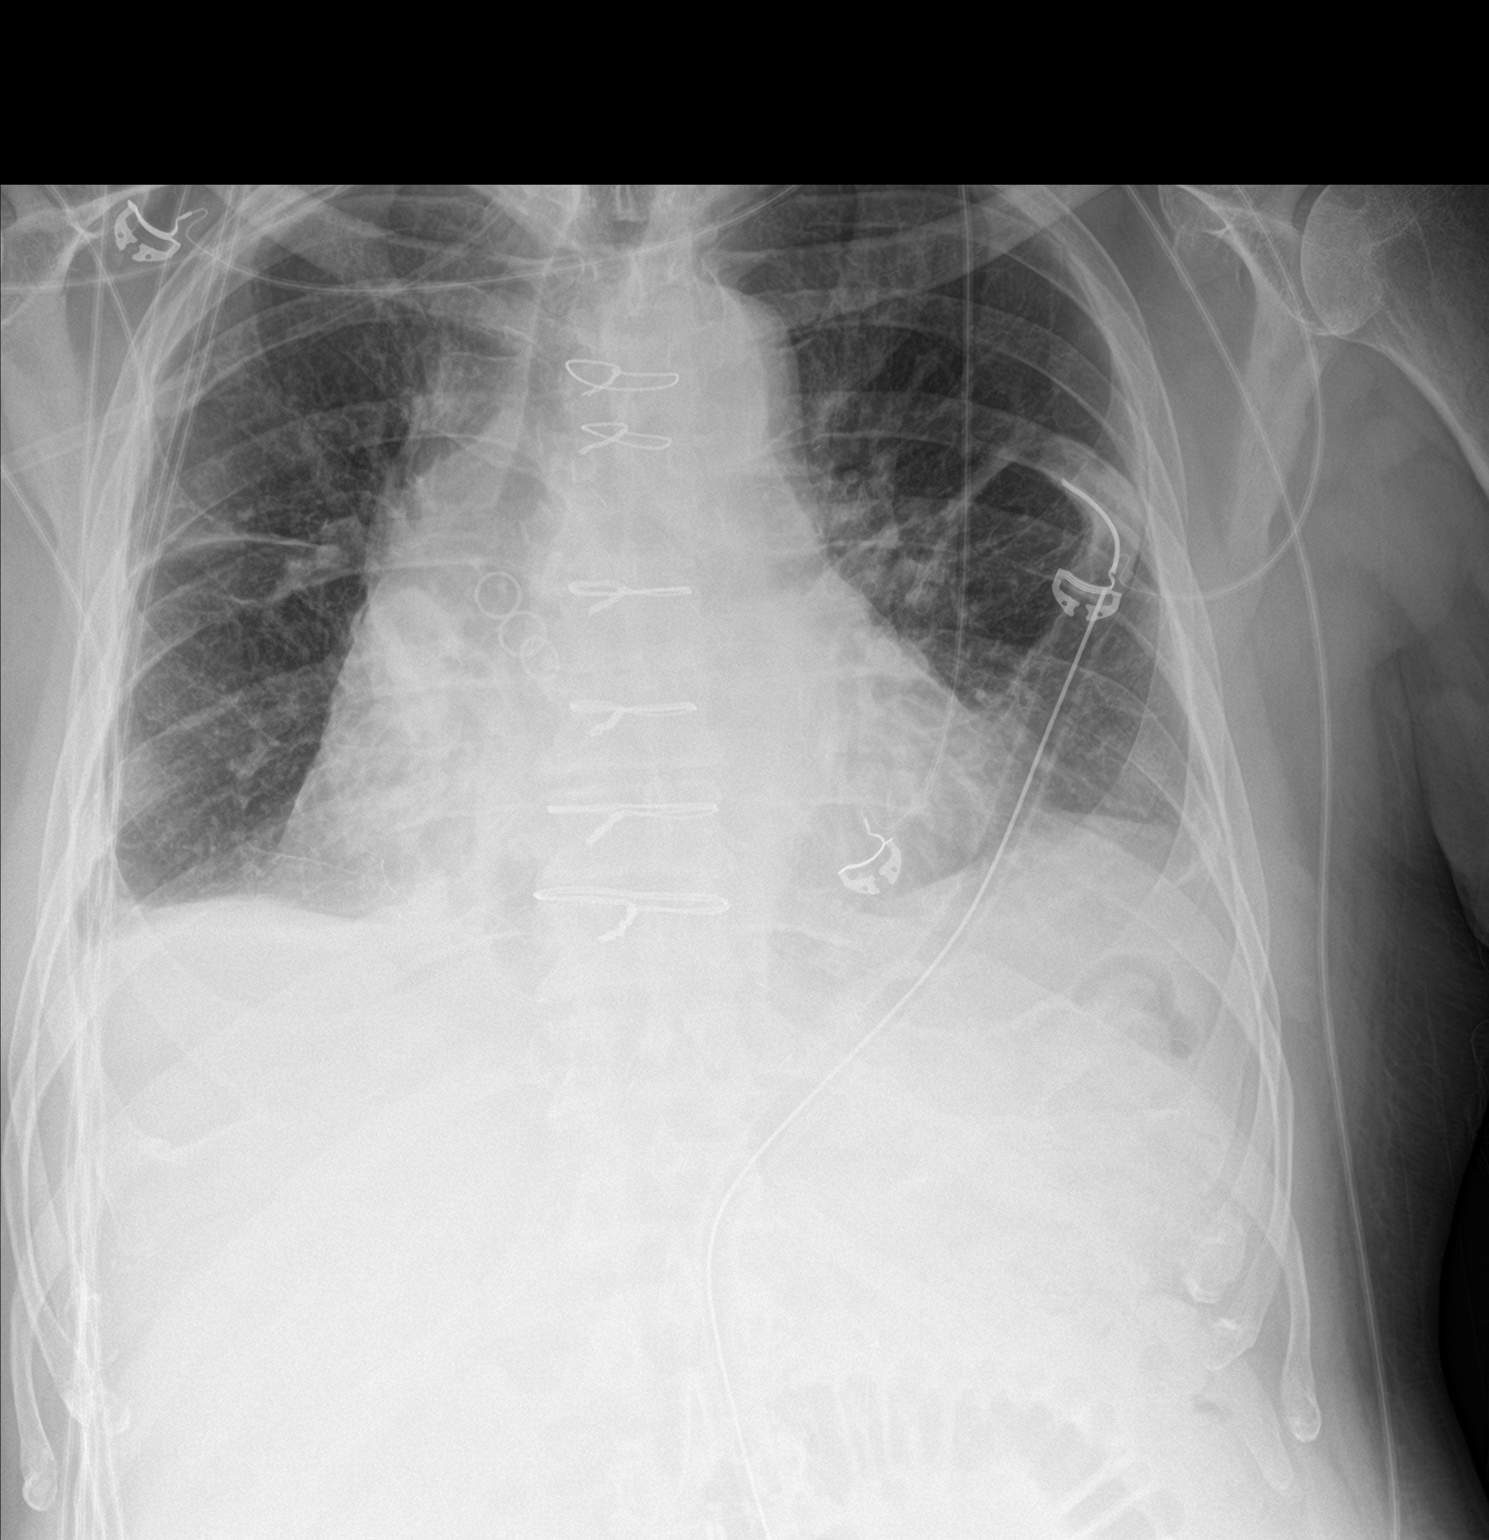

[chest lat]
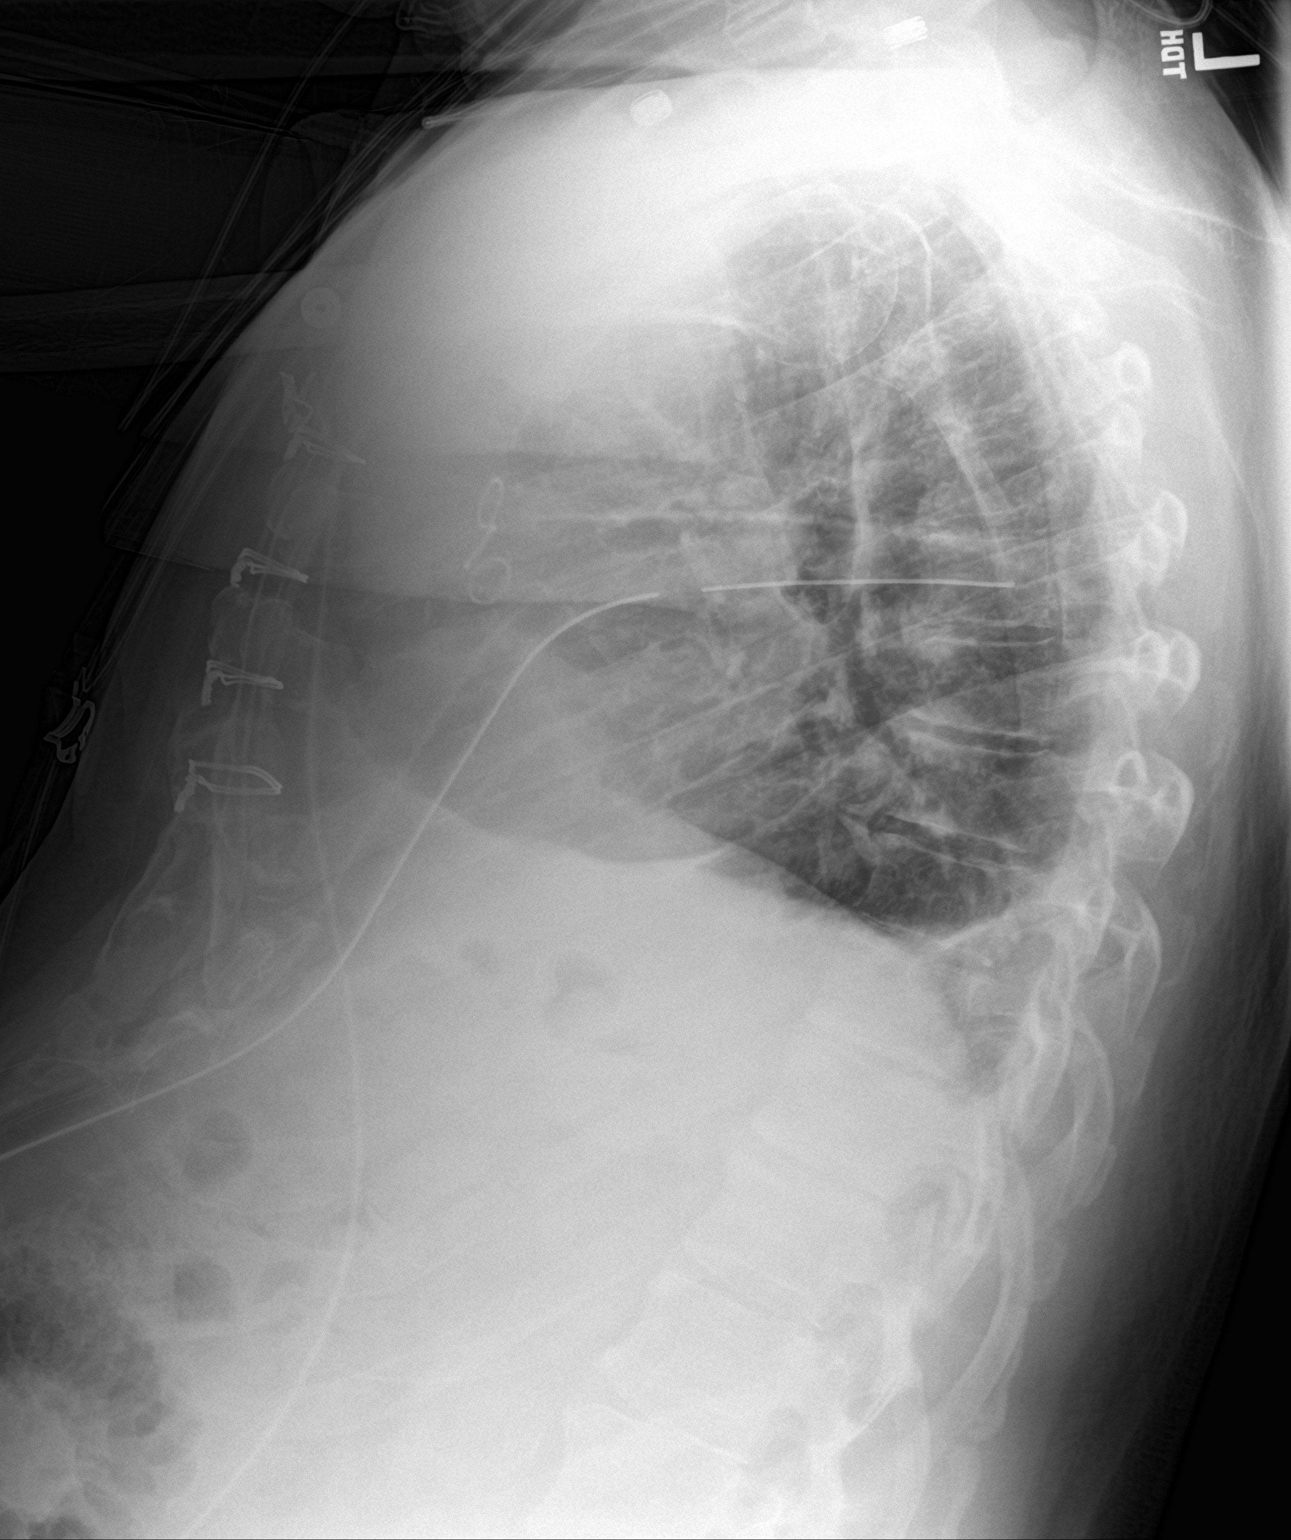

[2 of 2 positions shown; findings below may reference images not displayed]

FINDINGS: Right IJ catheter and mediastinal drain have been removed. Left
chest tube remains in place. The lungs are better expanded on
today's examination with decreased atelectasis. Very small bilateral
pleural effusions are identified. No pneumothorax. Mild basilar
atelectasis is noted.
IMPRESSION: Status post removal by IJ catheter and mediastinal drain.

Negative for pneumothorax with a left chest tube in place.

Small bilateral pleural effusions. The chest is better expanded on
today's exam with decreased atelectasis.

## 2019-04-20 ENCOUNTER — Other Ambulatory Visit: Payer: Self-pay

## 2019-04-20 DIAGNOSIS — Z20822 Contact with and (suspected) exposure to covid-19: Secondary | ICD-10-CM

## 2019-04-23 LAB — NOVEL CORONAVIRUS, NAA: SARS-CoV-2, NAA: NOT DETECTED
